# Patient Record
Sex: Female | Born: 1985
Health system: Southern US, Community
[De-identification: ages and names within clinical notes are randomized; demographics above are authoritative.]

## PROBLEM LIST (undated history)

## (undated) DIAGNOSIS — M069 Rheumatoid arthritis, unspecified: Secondary | ICD-10-CM

## (undated) DIAGNOSIS — Z720 Tobacco use: Secondary | ICD-10-CM

## (undated) DIAGNOSIS — R002 Palpitations: Secondary | ICD-10-CM

## (undated) DIAGNOSIS — M1990 Unspecified osteoarthritis, unspecified site: Secondary | ICD-10-CM

## (undated) DIAGNOSIS — R0602 Shortness of breath: Secondary | ICD-10-CM

## (undated) DIAGNOSIS — O3680X0 Pregnancy with inconclusive fetal viability, not applicable or unspecified: Secondary | ICD-10-CM

## (undated) DIAGNOSIS — R0789 Other chest pain: Secondary | ICD-10-CM

## (undated) HISTORY — DX: Unspecified osteoarthritis, unspecified site: M19.90

## (undated) HISTORY — DX: Shortness of breath: R06.02

## (undated) HISTORY — DX: Tobacco use: Z72.0

## (undated) HISTORY — DX: Rheumatoid arthritis, unspecified: M06.9

## (undated) HISTORY — DX: Other chest pain: R07.89

## (undated) HISTORY — DX: Palpitations: R00.2

---

## 2015-09-19 ENCOUNTER — Other Ambulatory Visit: Payer: Self-pay | Admitting: Family Medicine

## 2015-09-19 DIAGNOSIS — E049 Nontoxic goiter, unspecified: Secondary | ICD-10-CM

## 2015-09-22 ENCOUNTER — Other Ambulatory Visit: Payer: 59

## 2015-09-22 ENCOUNTER — Other Ambulatory Visit: Payer: Self-pay

## 2015-11-07 ENCOUNTER — Telehealth: Payer: Self-pay | Admitting: Cardiovascular Disease

## 2015-11-07 NOTE — Telephone Encounter (Signed)
Received records from [facility] for appointment on 12/12/15 with Dr Duke Salvia.  Records given to [facility] (medical records) for Dr Leonides Sake schedule on 12/12/15. lp

## 2015-11-25 ENCOUNTER — Ambulatory Visit (INDEPENDENT_AMBULATORY_CARE_PROVIDER_SITE_OTHER): Payer: 59 | Admitting: Cardiovascular Disease

## 2015-11-25 ENCOUNTER — Encounter: Payer: Self-pay | Admitting: Cardiovascular Disease

## 2015-11-25 VITALS — BP 112/73 | HR 89 | Ht 62.0 in | Wt 179.6 lb

## 2015-11-25 DIAGNOSIS — R0602 Shortness of breath: Secondary | ICD-10-CM | POA: Diagnosis not present

## 2015-11-25 DIAGNOSIS — Z720 Tobacco use: Secondary | ICD-10-CM

## 2015-11-25 DIAGNOSIS — R000 Tachycardia, unspecified: Secondary | ICD-10-CM

## 2015-11-25 DIAGNOSIS — I011 Acute rheumatic endocarditis: Secondary | ICD-10-CM | POA: Diagnosis not present

## 2015-11-25 DIAGNOSIS — M05711 Rheumatoid arthritis with rheumatoid factor of right shoulder without organ or systems involvement: Secondary | ICD-10-CM

## 2015-11-25 DIAGNOSIS — R002 Palpitations: Secondary | ICD-10-CM

## 2015-11-25 NOTE — Progress Notes (Signed)
Cardiology Office Note   Date:  11/27/2015   ID:  Asyria Kolander, DOB 27-Mar-1985, MRN [id number]  PCP:  Redmond Baseman, MD  Cardiologist:   Chilton Si, MD   Chief Complaint  Patient presents with  . New Patient (Initial Visit)    sob; when exerting self. rapid heartbeat lightheaded; very frequently. cramping in legs occasionally.      History of Present Illness: Nena Hampe is a 30 y.o. female with Rheumatoid Arthritis. who presents for an evaluation of chest pain and palpitations.  She reports frequent episodes of palpitations that have been ongoing for several months. Her heart beats fast and loud. She can hear beating in her ear and can see her chest moving. She also notes associated dizziness and weakness. The palpitations make her feel anxious, and anxiety also causes palpitations.  The symptoms occur both at rest and with exertion, though exertion does not seem to make it worse. She notes that if she stops thinking about it and does other activities that sometimes gets better. She also notes a sensation of chest tightness that radiates across the left side of her chest. This also occurs mostly at rest and happens at least once per week. The episode lasts for approximately 5 minutes and is associated with anxiety and shortness of breath. . However, she does not get much physical exercise. The most strenuous thing she does as cleaning her house, which does not cause worsening symptoms. She has joint swelling but denies edema. She also has not noted any orthopnea or PND.   Ms. Dicke also reports shortness of breath with minimal exertion.  She sometimes feels lightheaded and dizzy.  She did have one episode of syncope several months ago. However, this occurred in the setting of emesis and coughing.     Past Medical History:  Diagnosis Date  . Arthritis   . Palpitations 11/27/2015  . Rheumatoid arthritis (HCC) 11/27/2015  . Shortness of breath 11/27/2015  . Tobacco abuse  11/27/2015    No past surgical history on file.   Current Outpatient Prescriptions  Medication Sig Dispense Refill  . predniSONE (STERAPRED UNI-PAK 48 TAB) 5 MG (48) TBPK tablet USE AS DIRECTED FOR 12 DAYS  0   No current facility-administered medications for this visit.     Allergies:   Review of patient's allergies indicates not on file.    Social History:  The patient  reports that she has been smoking.  She does not have any smokeless tobacco history on file.   Family History:  The patient's family history includes Hypertension in her mother; Migraines in her mother.    ROS:  Please see the history of present illness.   Otherwise, review of systems are positive for allodynia.   All other systems are reviewed and negative.    PHYSICAL EXAM: VS:  BP 112/73   Pulse 89   Ht 5\' 2"  (1.575 m)   Wt 179 lb 9.6 oz (81.5 kg)   BMI 32.85 kg/m  , BMI Body mass index is 32.85 kg/m. GENERAL:  Well appearing HEENT:  Pupils equal round and reactive, fundi not visualized, oral mucosa unremarkable NECK:  No jugular venous distention, waveform within normal limits, carotid upstroke brisk and symmetric, no bruits, no thyromegaly LYMPHATICS:  No cervical adenopathy LUNGS:  Clear to auscultation bilaterally HEART:  RRR.  PMI not displaced or sustained,S1 and S2 within normal limits, no S3, no S4, no clicks, no rubs, no murmurs ABD:  Flat, positive bowel sounds  normal in frequency in pitch, no bruits, no rebound, no guarding, no midline pulsatile mass, no hepatomegaly, no splenomegaly EXT:  2 plus pulses throughout, no edema, no cyanosis no clubbing SKIN:  No rashes no nodules NEURO:  Cranial nerves II through XII grossly intact, motor grossly intact throughout PSYCH:  Cognitively intact, oriented to person place and time    EKG:  EKG is ordered today. The ekg ordered 11/25/15 demonstrates sinus rhythm rate 89 bpm.     Recent Labs: No results found for requested labs within last 8760  hours.  09/16/15: WBC 12.9, hemoglobin 9.7, hematocrit 32.5, platelets 431 Sodium 137, potassium 4.8, BUN 9, creatinine 0.56 AST 31, ALT 40 TSH within normal limits   Lipid Panel No results found for: CHOL, TRIG, HDL, CHOLHDL, VLDL, LDLCALC, LDLDIRECT    Wt Readings from Last 3 Encounters:  11/25/15 179 lb 9.6 oz (81.5 kg)      ASSESSMENT AND PLAN:  # Palpitations:  # Shortness of breath:  Ms. Pirro reports daily palpitations and was noted to have tachycardia and appointment with her PCP and rheumatologist. Today she is in sinus rhythm. She is having appropriate laboratory testing which has been notable for mild anemia but otherwise unremarkable. Her to be PVC is elevated, though she is on prednisone. We will obtain a 48 hour event monitor. Suspect that her symptoms may be related to anxiety. However,  we will also check a VQ scan to rule out chronic PE. Given her shortness of breath we will also obtain an echocardiogram to ensure that she does not have a pericardial effusion or any evidence of heart failure.   # Tobacco abuse: We discussed the importance of smoking cessation and she expressed understanding.   Current medicines are reviewed at length with the patient today.  The patient does not have concerns regarding medicines.  The following changes have been made:  no change  Labs/ tests ordered today include:   Orders Placed This Encounter  Procedures  . NM Pulmonary Perf and Vent  . DG Chest 2 View  . Holter monitor - 48 hour  . EKG 12-Lead  . ECHOCARDIOGRAM COMPLETE     Disposition:   FU with Sophia Cubero C. Duke Salvia, MD, [facility] in 1 month.     This note was written with the assistance of speech recognition software.  Please excuse any transcriptional errors.  Signed, Devere Brem C. Duke Salvia, MD, [facility]  11/27/2015 5:20 PM    [facility]

## 2015-11-25 NOTE — Patient Instructions (Addendum)
Medication Instructions:  Your physician recommends that you continue on your current medications as directed. Please refer to the Current Medication list given to you today.  Labwork: none  Testing/Procedures: A chest x-ray takes a picture of the organs and structures inside the chest, including the heart, lungs, and blood vessels. This test can show several things, including, whether the heart is enlarges; whether fluid is building up in the lungs; and whether pacemaker / defibrillator leads are still in place.  VQ SCAN  Your physician has recommended that you wear a holter monitor. Holter monitors are medical devices that record the heart's electrical activity. Doctors most often use these monitors to diagnose arrhythmias. Arrhythmias are problems with the speed or rhythm of the heartbeat. The monitor is a small, portable device. You can wear one while you do your normal daily activities. This is usually used to diagnose what is causing palpitations/syncope (passing out). 63 HOUR  Your physician has requested that you have an echocardiogram. Echocardiography is a painless test that uses sound waves to create images of your heart. It provides your doctor with information about the size and shape of your heart and how well your heart's chambers and valves are working. This procedure takes approximately one hour. There are no restrictions for this procedure. [facility] AT [address]  Follow-Up: Your physician recommends that you schedule a follow-up appointment in: 1 MONTH OV  If you need a refill on your cardiac medications before your next appointment, please call your pharmacy.  Ventilation-Perfusion Scan A ventilation-perfusion scan is a scan to look at the airflow (ventilation) and blood flow (perfusion) in your lungs. It is most often used to look for blood clots that may have traveled to your lungs. During this scan, radioactive compounds are injected into your body or  are breathed in (inhale). These radioactive compounds are detected by a special camera during the scan, are given at very low doses, are not harmful to you, and last in your body for a very short time.  LET [facility] CARE PROVIDER KNOW ABOUT:  Any allergies you have.  All medicines you are taking, including vitamins, herbs, eye drops, creams, and over-the-counter medicines.  Any blood disorders you have.  Previous surgeries you have had.  Medical conditions you have.  Possibility of pregnancy, if this applies.  Breastfeeding, if this applies. RISKS AND COMPLICATIONS Generally, this is a safe procedure. However, as with any procedure, complications can occur. A possible complication includes having an allergic reaction to the radioactive compounds.  BEFORE THE PROCEDURE  Do not smoke before your test.  Take medicine as directed by your health care provider. PROCEDURE  A small needle will be placed in a vein in your arm or hand. This needle will stay in place for the entire exam.  A small amount of very short-acting radioactive material will be injected.  Your lungs will then be scanned using a special camera. This camera will record the images.  You will be asked to inhale a second radioactive compound. After this, the lungs are scanned again. AFTER THE PROCEDURE  You may go home unless your health care provider instructs you differently.  You may continue with normal activities and diet as instructed by your health care provider.   This information is not intended to replace advice given to you by your health care provider. Make sure you discuss any questions you have with your health care provider.   Document Released: 03/02/2000 Document  Revised: 03/26/2014 Document Reviewed: 09/18/2012 Elsevier Interactive Patient Education [employer]  Echocardiogram An echocardiogram, or echocardiography, uses sound waves (ultrasound) to produce an image of your heart. The  echocardiogram is simple, painless, obtained within a short period of time, and offers valuable information to your health care provider. The images from an echocardiogram can provide information such as:  Evidence of coronary artery disease (CAD).  Heart size.  Heart muscle function.  Heart valve function.  Aneurysm detection.  Evidence of a past heart attack.  Fluid buildup around the heart.  Heart muscle thickening.  Assess heart valve function. LET [facility] CARE PROVIDER KNOW ABOUT:  Any allergies you have.  All medicines you are taking, including vitamins, herbs, eye drops, creams, and over-the-counter medicines.  Previous problems you or members of your family have had with the use of anesthetics.  Any blood disorders you have.  Previous surgeries you have had.  Medical conditions you have.  Possibility of pregnancy, if this applies. BEFORE THE PROCEDURE  No special preparation is needed. Eat and drink normally.  PROCEDURE   In order to produce an image of your heart, gel will be applied to your chest and a wand-like tool (transducer) will be moved over your chest. The gel will help transmit the sound waves from the transducer. The sound waves will harmlessly bounce off your heart to allow the heart images to be captured in real-time motion. These images will then be recorded.  You may need an IV to receive a medicine that improves the quality of the pictures. AFTER THE PROCEDURE You may return to your normal schedule including diet, activities, and medicines, unless your health care provider tells you otherwise.   This information is not intended to replace advice given to you by your health care provider. Make sure you discuss any questions you have with your health care provider.   Document Released: 03/02/2000 Document Revised: 03/26/2014 Document Reviewed: 11/10/2012 Elsevier Interactive Patient Education [employer]

## 2015-11-27 ENCOUNTER — Encounter: Payer: Self-pay | Admitting: Cardiovascular Disease

## 2015-11-27 DIAGNOSIS — R0602 Shortness of breath: Secondary | ICD-10-CM

## 2015-11-27 DIAGNOSIS — R002 Palpitations: Secondary | ICD-10-CM

## 2015-11-27 DIAGNOSIS — M069 Rheumatoid arthritis, unspecified: Secondary | ICD-10-CM

## 2015-11-27 DIAGNOSIS — Z720 Tobacco use: Secondary | ICD-10-CM

## 2015-11-27 HISTORY — DX: Tobacco use: Z72.0

## 2015-11-27 HISTORY — DX: Rheumatoid arthritis, unspecified: M06.9

## 2015-11-27 HISTORY — DX: Shortness of breath: R06.02

## 2015-11-27 HISTORY — DX: Palpitations: R00.2

## 2015-12-02 ENCOUNTER — Telehealth: Payer: Self-pay | Admitting: Cardiovascular Disease

## 2015-12-02 NOTE — Telephone Encounter (Signed)
Patient and I have played phone tag regarding times for appts.  I left times and instructions on patient's voicemail on 11-29-15.

## 2015-12-05 ENCOUNTER — Encounter (HOSPITAL_COMMUNITY): Payer: 59

## 2015-12-05 ENCOUNTER — Ambulatory Visit (HOSPITAL_COMMUNITY): Payer: 59

## 2015-12-12 ENCOUNTER — Ambulatory Visit (INDEPENDENT_AMBULATORY_CARE_PROVIDER_SITE_OTHER): Payer: 59

## 2015-12-12 ENCOUNTER — Other Ambulatory Visit: Payer: Self-pay

## 2015-12-12 ENCOUNTER — Ambulatory Visit (HOSPITAL_COMMUNITY): Payer: 59 | Attending: Cardiovascular Disease

## 2015-12-12 ENCOUNTER — Ambulatory Visit: Payer: 59 | Admitting: Cardiovascular Disease

## 2015-12-12 DIAGNOSIS — R000 Tachycardia, unspecified: Secondary | ICD-10-CM | POA: Diagnosis not present

## 2015-12-12 DIAGNOSIS — I011 Acute rheumatic endocarditis: Secondary | ICD-10-CM

## 2015-12-12 DIAGNOSIS — R0602 Shortness of breath: Secondary | ICD-10-CM | POA: Diagnosis not present

## 2015-12-15 ENCOUNTER — Telehealth: Payer: Self-pay | Admitting: *Deleted

## 2015-12-15 NOTE — Telephone Encounter (Signed)
Advised patient of echo results  Did ask patient about her VQ scan that she cancelled  Patient stated she cancelled secondary to cost and will discuss further with Dr Duke Salvia at follow up next month

## 2015-12-30 ENCOUNTER — Ambulatory Visit: Payer: 59 | Admitting: Cardiovascular Disease

## 2016-01-22 NOTE — Progress Notes (Deleted)
Cardiology Office Note   Date:  01/22/2016   ID:  Sandra Sosa, DOB 02-Apr-1985, MRN [id number]  PCP:  Redmond Baseman, MD  Cardiologist:   Chilton Si, MD   No chief complaint on file.     History of Present Illness: Sandra Sosa is a 30 y.o. female with Rheumatoid Arthritis. who presents for an evaluation of chest pain and palpitations.  She reports frequent episodes of palpitations that have been ongoing for several months. Her heart beats fast and loud. She can hear beating in her ear and can see her chest moving. She also notes associated dizziness and weakness. The palpitations make her feel anxious, and anxiety also causes palpitations.  The symptoms occur both at rest and with exertion, though exertion does not seem to make it worse. She notes that if she stops thinking about it and does other activities that sometimes gets better. She also notes a sensation of chest tightness that radiates across the left side of her chest. This also occurs mostly at rest and happens at least once per week. The episode lasts for approximately 5 minutes and is associated with anxiety and shortness of breath. . However, she does not get much physical exercise. The most strenuous thing she does as cleaning her house, which does not cause worsening symptoms. She has joint swelling but denies edema. She also has not noted any orthopnea or PND.   [name] also reports shortness of breath with minimal exertion.  She sometimes feels lightheaded and dizzy.  She did have one episode of syncope several months ago. However, this occurred in the setting of emesis and coughing.     Past Medical History:  Diagnosis Date  . Arthritis   . Palpitations 11/27/2015  . Rheumatoid arthritis (HCC) 11/27/2015  . Shortness of breath 11/27/2015  . Tobacco abuse 11/27/2015    No past surgical history on file.   Current Outpatient Prescriptions  Medication Sig Dispense Refill  . predniSONE (STERAPRED  UNI-PAK 48 TAB) 5 MG (48) TBPK tablet USE AS DIRECTED FOR 12 DAYS  0   No current facility-administered medications for this visit.     Allergies:   Patient has no allergy information on record.    Social History:  The patient  reports that she has been smoking.  She does not have any smokeless tobacco history on file.   Family History:  The patient's family history includes Hypertension in her mother; Migraines in her mother.    ROS:  Please see the history of present illness.   Otherwise, review of systems are positive for allodynia.   All other systems are reviewed and negative.    PHYSICAL EXAM: VS:  There were no vitals taken for this visit. , BMI There is no height or weight on file to calculate BMI. GENERAL:  Well appearing HEENT:  Pupils equal round and reactive, fundi not visualized, oral mucosa unremarkable NECK:  No jugular venous distention, waveform within normal limits, carotid upstroke brisk and symmetric, no bruits, no thyromegaly LYMPHATICS:  No cervical adenopathy LUNGS:  Clear to auscultation bilaterally HEART:  RRR.  PMI not displaced or sustained,S1 and S2 within normal limits, no S3, no S4, no clicks, no rubs, no murmurs ABD:  Flat, positive bowel sounds normal in frequency in pitch, no bruits, no rebound, no guarding, no midline pulsatile mass, no hepatomegaly, no splenomegaly EXT:  2 plus pulses throughout, no edema, no cyanosis no clubbing SKIN:  No rashes no nodules NEURO:  Cranial  nerves II through XII grossly intact, motor grossly intact throughout [facility]:  Cognitively intact, oriented to person place and time    EKG:  EKG is ordered today. The ekg ordered 11/25/15 demonstrates sinus rhythm rate 89 bpm.     Recent Labs: No results found for requested labs within last 8760 hours.  09/16/15: WBC 12.9, hemoglobin 9.7, hematocrit 32.5, platelets 431 Sodium 137, potassium 4.8, BUN 9, creatinine 0.56 AST 31, ALT 40 TSH within normal limits   Lipid  Panel No results found for: CHOL, TRIG, HDL, CHOLHDL, VLDL, LDLCALC, LDLDIRECT    Wt Readings from Last 3 Encounters:  11/25/15 81.5 kg (179 lb 9.6 oz)      ASSESSMENT AND PLAN:  # Palpitations:  # Shortness of breath:  [name] reports daily palpitations and was noted to have tachycardia and appointment with her PCP and rheumatologist. Today she is in sinus rhythm. She is having appropriate laboratory testing which has been notable for mild anemia but otherwise unremarkable. Her to be PVC is elevated, though she is on prednisone. We will obtain a 48 hour event monitor. Suspect that her symptoms may be related to anxiety. However,  we will also check a VQ scan to rule out chronic PE. Given her shortness of breath we will also obtain an echocardiogram to ensure that she does not have a pericardial effusion or any evidence of heart failure.   # Tobacco abuse: We discussed the importance of smoking cessation and she expressed understanding.   Current medicines are reviewed at length with the patient today.  The patient does not have concerns regarding medicines.  The following changes have been made:  no change  Labs/ tests ordered today include:   No orders of the defined types were placed in this encounter.    Disposition:   FU with Dae Highley C. Duke Salvia, MD, [facility] in 1 month.     This note was written with the assistance of speech recognition software.  Please excuse any transcriptional errors.  Signed, Dael Howland C. Duke Salvia, MD, [facility]  01/22/2016 3:46 PM    [facility]

## 2016-01-23 ENCOUNTER — Ambulatory Visit: Payer: 59 | Admitting: Cardiovascular Disease

## 2016-02-03 ENCOUNTER — Ambulatory Visit (INDEPENDENT_AMBULATORY_CARE_PROVIDER_SITE_OTHER): Payer: 59 | Admitting: Cardiovascular Disease

## 2016-02-03 ENCOUNTER — Encounter: Payer: Self-pay | Admitting: Cardiovascular Disease

## 2016-02-03 VITALS — BP 111/78 | HR 103 | Ht 62.0 in | Wt 180.2 lb

## 2016-02-03 DIAGNOSIS — R0602 Shortness of breath: Secondary | ICD-10-CM

## 2016-02-03 DIAGNOSIS — Z720 Tobacco use: Secondary | ICD-10-CM | POA: Diagnosis not present

## 2016-02-03 DIAGNOSIS — R002 Palpitations: Secondary | ICD-10-CM

## 2016-02-03 NOTE — Progress Notes (Signed)
Cardiology Office Note   Date:  02/03/2016   ID:  Sandra Sosa, DOB Nov 06, 1985, MRN [id number]  PCP:  Redmond Baseman, MD  Cardiologist:   Chilton Si, MD   Chief Complaint  Patient presents with  . Follow-up    monitor results      History of Present Illness: Sandra Sosa is a 30 y.o. female with Rheumatoid Arthritis and palpitations who presents for follow up.  She was seen in clinic 11/25/15 due to palpitations.  She was referred for a 48 hour Holter That showed an average heart rate of 95 bpm and very rare PACs and PVCs. These were not associated with any symptoms at the time of the study. She also reported shortness of breath so she had an echo 12/12/15 that revealed LVEF 55-60% and no abnormalities. Since that time she continues to have palpitations frequently. He typically occurs in the setting of anxiety. She denies chest pain, lower extremity edema, orthopnea, or PND. She does have joint swelling but that is attributable to her rheumatoid arthritis. She has been holding her arthritis medication because she had her husband are trying to get pregnant.   Past Medical History:  Diagnosis Date  . Arthritis   . Palpitations 11/27/2015  . Rheumatoid arthritis (HCC) 11/27/2015  . Shortness of breath 11/27/2015  . Tobacco abuse 11/27/2015    No past surgical history on file.   Current Outpatient Prescriptions  Medication Sig Dispense Refill  . acetaminophen (TYLENOL) 325 MG tablet Take 650 mg by mouth every 6 (six) hours as needed.     No current facility-administered medications for this visit.     Allergies:   Patient has no allergy information on record.    Social History:  The patient  reports that she has been smoking.  She does not have any smokeless tobacco history on file.   Family History:  The patient's family history includes Hypertension in her mother; Migraines in her mother.    ROS:  Please see the history of present illness.   Otherwise, review  of systems are positive for allodynia.   All other systems are reviewed and negative.    PHYSICAL EXAM: VS:  BP 111/78   Pulse (!) 103   Ht 5\' 2"  (1.575 m)   Wt 81.7 kg (180 lb 3.2 oz)   BMI 32.96 kg/m  , BMI Body mass index is 32.96 kg/m. GENERAL:  Well appearing HEENT:  Pupils equal round and reactive, fundi not visualized, oral mucosa unremarkable NECK:  No jugular venous distention, waveform within normal limits, carotid upstroke brisk and symmetric, no bruits, no thyromegaly LYMPHATICS:  No cervical adenopathy LUNGS:  Clear to auscultation bilaterally HEART:  RRR.  PMI not displaced or sustained,S1 and S2 within normal limits, no S3, no S4, no clicks, no rubs, no murmurs ABD:  Flat, positive bowel sounds normal in frequency in pitch, no bruits, no rebound, no guarding, no midline pulsatile mass, no hepatomegaly, no splenomegaly EXT:  2 plus pulses throughout, no edema, no cyanosis no clubbing SKIN:  No rashes no nodules NEURO:  Cranial nerves II through XII grossly intact, motor grossly intact throughout PSYCH:  Cognitively intact, oriented to person place and time    EKG:  EKG is ordered today. The ekg ordered 11/25/15 demonstrates sinus rhythm rate 89 bpm.     Recent Labs: No results found for requested labs within last 8760 hours.  09/16/15: WBC 12.9, hemoglobin 9.7, hematocrit 32.5, platelets 431 Sodium 137, potassium 4.8, BUN  9, creatinine 0.56 AST 31, ALT 40 TSH within normal limits  48 Hour Holter Monitor 12/12/15:  Quality: Fair.  Baseline artifact. Predominant rhythm: Sinus rhythm Average heart rate: 95 bpm Min heart rate: 50 bpm Max heart rate: 170 bpm  Rare PACs and PVCs  Short runs of atrial tachycardia   Echo 12/12/15: Study Conclusions  - Left ventricle: The cavity size was normal. Systolic function was   normal. The estimated ejection fraction was in the range of 55%   to 60%. Wall motion was normal; there were no regional wall   motion  abnormalities. Left ventricular diastolic function   parameters were normal.  Lipid Panel No results found for: CHOL, TRIG, HDL, CHOLHDL, VLDL, LDLCALC, LDLDIRECT    Wt Readings from Last 3 Encounters:  02/03/16 81.7 kg (180 lb 3.2 oz)  11/25/15 81.5 kg (179 lb 9.6 oz)      ASSESSMENT AND PLAN:  # Palpitations:  # Shortness of breath:  48 hour Holter showed occasional PACs and PVCs.  I suspect that many of her symptoms are related to anxiety. Laboratory testing his all been within normal limits. There is no evidence of heart failure on her echo and she is euvolemic on exam. I did inform her that it is possible her symptoms will worsen in the setting of pregnancy. I reiterated the fact that this is not dangerous to either her or the baby. We discussed the option of trying a beta blocker. However, this is likely to make her more symptomatic as her blood pressure is low. She agrees that we will avoid pharmacologic interventions at this time.   # Tobacco abuse: We discussed the importance of smoking cessation and she expressed understanding.   Current medicines are reviewed at length with the patient today.  The patient does not have concerns regarding medicines.  The following changes have been made:  no change  Labs/ tests ordered today include:   No orders of the defined types were placed in this encounter.    Disposition:   FU with Marchello Rothgeb C. Duke Salvia, MD, [facility] in 3 months.     This note was written with the assistance of speech recognition software.  Please excuse any transcriptional errors.  Signed, Brita Jurgensen C. Duke Salvia, MD, [facility]  02/03/2016 3:39 PM    [facility]

## 2016-02-03 NOTE — Patient Instructions (Signed)
Medication Instructions:  Your physician recommends that you continue on your current medications as directed. Please refer to the Current Medication list given to you today.  Labwork: none  Testing/Procedures: none  Follow-Up: Your physician recommends that you schedule a follow-up appointment in: 3 month ov  If you need a refill on your cardiac medications before your next appointment, please call your pharmacy.  

## 2016-03-07 ENCOUNTER — Telehealth: Payer: Self-pay | Admitting: Cardiovascular Disease

## 2016-03-07 NOTE — Telephone Encounter (Signed)
Spoke with pt husband, his wife is having palpitations that are related to anxiety. He is researching getting a dog for the patient to help with her anxiety. He is asking for a note from [place] that would state a dog maybe helpful to the patient and her symptoms. Will forward to dr Duke Salvia for okay to generate letter.

## 2016-03-07 NOTE — Telephone Encounter (Signed)
Please call,husband need to discuss getting some help for his wife emotional condition.

## 2016-03-07 NOTE — Telephone Encounter (Signed)
I think that this might be helpful.  However I don't specialize in treating anxiety.  It might be helpful for her to talk with a psychiatrist or psychologist.  I'm happy to give her a letter stating that she doesn't have any cardiac issues that would prohibit her from having a dog, but I'm not sure how that would be helpful to her.

## 2016-03-08 NOTE — Telephone Encounter (Signed)
Left message to call back  

## 2016-03-08 NOTE — Telephone Encounter (Signed)
Advised husband  

## 2016-05-09 ENCOUNTER — Ambulatory Visit: Payer: 59 | Admitting: Cardiovascular Disease

## 2016-05-09 NOTE — Progress Notes (Deleted)
Cardiology Office Note   Date:  05/09/2016   ID:  Sandra Sosa, DOB 1985/07/29, MRN [id number]  PCP:  Redmond Baseman, MD  Cardiologist:   Chilton Si, MD   No chief complaint on file.     History of Present Illness: Sandra Sosa is a 31 y.o. female with Rheumatoid Arthritis, PACs, PVCs and atrial tachycardia who presents for follow up.  She was seen in clinic 11/25/15 due to palpitations.  She was referred for a 48 hour Holter that showed an average heart rate of 95 bpm and very rare PACs and PVCs.  It also showed a short run of atrial tachycardia. These were not associated with any symptoms at the time of the study. She also reported shortness of breath so she had an echo 12/12/15 that revealed LVEF 55-60% and no abnormalities. Since that time she continues to have palpitations frequently. He typically occurs in the setting of anxiety. She denies chest pain, lower extremity edema, orthopnea, or PND. She does have joint swelling but that is attributable to her rheumatoid arthritis. She has been holding her arthritis medication because she had her husband are trying to get pregnant.    F/u tobacco    Past Medical History:  Diagnosis Date  . Arthritis   . Palpitations 11/27/2015  . Rheumatoid arthritis (HCC) 11/27/2015  . Shortness of breath 11/27/2015  . Tobacco abuse 11/27/2015    No past surgical history on file.   Current Outpatient Prescriptions  Medication Sig Dispense Refill  . acetaminophen (TYLENOL) 325 MG tablet Take 650 mg by mouth every 6 (six) hours as needed.     No current facility-administered medications for this visit.     Allergies:   Patient has no allergy information on record.    Social History:  The patient  reports that she has been smoking.  She does not have any smokeless tobacco history on file.   Family History:  The patient's family history includes Hypertension in her mother; Migraines in her mother.    ROS:  Please see the history  of present illness.   Otherwise, review of systems are positive for allodynia.   All other systems are reviewed and negative.    PHYSICAL EXAM: VS:  There were no vitals taken for this visit. , BMI There is no height or weight on file to calculate BMI. GENERAL:  Well appearing HEENT:  Pupils equal round and reactive, fundi not visualized, oral mucosa unremarkable NECK:  No jugular venous distention, waveform within normal limits, carotid upstroke brisk and symmetric, no bruits, no thyromegaly LYMPHATICS:  No cervical adenopathy LUNGS:  Clear to auscultation bilaterally HEART:  RRR.  PMI not displaced or sustained,S1 and S2 within normal limits, no S3, no S4, no clicks, no rubs, no murmurs ABD:  Flat, positive bowel sounds normal in frequency in pitch, no bruits, no rebound, no guarding, no midline pulsatile mass, no hepatomegaly, no splenomegaly EXT:  2 plus pulses throughout, no edema, no cyanosis no clubbing SKIN:  No rashes no nodules NEURO:  Cranial nerves II through XII grossly intact, motor grossly intact throughout PSYCH:  Cognitively intact, oriented to person place and time    EKG:  EKG is ordered today. The ekg ordered 11/25/15 demonstrates sinus rhythm rate 89 bpm.     Recent Labs: No results found for requested labs within last 8760 hours.  09/16/15: WBC 12.9, hemoglobin 9.7, hematocrit 32.5, platelets 431 Sodium 137, potassium 4.8, BUN 9, creatinine 0.56 AST 31, ALT 40  TSH within normal limits  48 Hour Holter Monitor 12/12/15:  Quality: Fair.  Baseline artifact. Predominant rhythm: Sinus rhythm Average heart rate: 95 bpm Min heart rate: 50 bpm Max heart rate: 170 bpm  Rare PACs and PVCs  Short runs of atrial tachycardia   Echo 12/12/15: Study Conclusions  - Left ventricle: The cavity size was normal. Systolic function was   normal. The estimated ejection fraction was in the range of 55%   to 60%. Wall motion was normal; there were no regional wall   motion  abnormalities. Left ventricular diastolic function   parameters were normal.  Lipid Panel No results found for: CHOL, TRIG, HDL, CHOLHDL, VLDL, LDLCALC, LDLDIRECT    Wt Readings from Last 3 Encounters:  02/03/16 81.7 kg (180 lb 3.2 oz)  11/25/15 81.5 kg (179 lb 9.6 oz)      ASSESSMENT AND PLAN:  # Palpitations:  # Shortness of breath:  48 hour Holter showed occasional PACs and PVCs.  I suspect that many of her symptoms are related to anxiety. Laboratory testing his all been within normal limits. There is no evidence of heart failure on her echo and she is euvolemic on exam. I did inform her that it is possible her symptoms will worsen in the setting of pregnancy. I reiterated the fact that this is not dangerous to either her or the baby. We discussed the option of trying a beta blocker. However, this is likely to make her more symptomatic as her blood pressure is low. She agrees that we will avoid pharmacologic interventions at this time.   # Tobacco abuse: We discussed the importance of smoking cessation and she expressed understanding.   Current medicines are reviewed at length with the patient today.  The patient does not have concerns regarding medicines.  The following changes have been made:  no change  Labs/ tests ordered today include:   No orders of the defined types were placed in this encounter.    Disposition:   FU with Dodd Schmid C. Duke Salvia, MD, [facility] in 3 months.     This note was written with the assistance of speech recognition software.  Please excuse any transcriptional errors.  Signed, Aydan Phoenix C. Duke Salvia, MD, [facility]  05/09/2016 10:16 AM    [facility]

## 2016-05-14 ENCOUNTER — Ambulatory Visit (INDEPENDENT_AMBULATORY_CARE_PROVIDER_SITE_OTHER): Payer: BLUE CROSS/BLUE SHIELD | Admitting: Cardiovascular Disease

## 2016-05-14 ENCOUNTER — Encounter: Payer: Self-pay | Admitting: Cardiovascular Disease

## 2016-05-14 VITALS — BP 121/82 | HR 110 | Ht 62.0 in | Wt 182.6 lb

## 2016-05-14 DIAGNOSIS — R000 Tachycardia, unspecified: Secondary | ICD-10-CM

## 2016-05-14 DIAGNOSIS — Z720 Tobacco use: Secondary | ICD-10-CM

## 2016-05-14 DIAGNOSIS — Z13220 Encounter for screening for lipoid disorders: Secondary | ICD-10-CM | POA: Diagnosis not present

## 2016-05-14 DIAGNOSIS — R0789 Other chest pain: Secondary | ICD-10-CM | POA: Diagnosis not present

## 2016-05-14 HISTORY — DX: Other chest pain: R07.89

## 2016-05-14 NOTE — Progress Notes (Signed)
Cardiology Office Note   Date:  05/14/2016   ID:  Sandra Sosa, DOB 06/16/1985, MRN [id number]  PCP:  Redmond Baseman, MD  Cardiologist:   Chilton Si, MD   Chief Complaint  Patient presents with  . Follow-up  . Shortness of Breath    occasionally.  . Chest Pain    pressure.      History of Present Illness: Sandra Sosa is a 31 y.o. female with Rheumatoid Arthritis, PACs, PVCs and atrial tachycardia who presents for follow up.  She was seen in clinic 11/25/15 due to palpitations.  She was referred for a 48 hour Holter that showed an average heart rate of 95 bpm and very rare PACs and PVCs.  It also showed a short run of atrial tachycardia. These were not associated with any symptoms at the time of the study. She also reported shortness of breath so she had an echo 12/12/15 that revealed LVEF 55-60% and no abnormalities. Since that time she continues to have palpitations frequently. He typically occurs in the setting of anxiety. She denies chest pain, lower extremity edema, orthopnea, or PND. She does have joint swelling but that is attributable to her rheumatoid arthritis. She has been holding her arthritis medication because she had her husband are trying to get pregnant.  Since her last appointment Sandra Sosa continues to have palpitations and feels pressure in her chest that lasts for days at a time.  She reports dizziness that is worse when she turns over in bed at night or when she is walking.  She denies syncope.  She is currently struggling with a URI.  She has episodes when she feels like she has to take a deep breath.  She denies lower extremity edema, orthopnea or PND.     Past Medical History:  Diagnosis Date  . Arthritis   . Atypical chest pain 05/14/2016  . Palpitations 11/27/2015  . Rheumatoid arthritis (HCC) 11/27/2015  . Shortness of breath 11/27/2015  . Tobacco abuse 11/27/2015    No past surgical history on file.   No current outpatient prescriptions on  file.   No current facility-administered medications for this visit.     Allergies:   Patient has no allergy information on record.    Social History:  The patient  reports that she has been smoking.  She has never used smokeless tobacco.   Family History:  The patient's family history includes Hypertension in her mother; Migraines in her mother.    ROS:  Please see the history of present illness.   Otherwise, review of systems are positive for allodynia.   All other systems are reviewed and negative.    PHYSICAL EXAM: VS:  BP 121/82   Pulse (!) 110   Ht 5\' 2"  (1.575 m)   Wt 82.8 kg (182 lb 9.6 oz)   BMI 33.40 kg/m  , BMI Body mass index is 33.4 kg/m. GENERAL:  Well appearing HEENT:  Pupils equal round and reactive, fundi not visualized, oral mucosa unremarkable NECK:  No jugular venous distention, waveform within normal limits, carotid upstroke brisk and symmetric, no bruits LYMPHATICS:  No cervical adenopathy LUNGS:  Clear to auscultation bilaterally HEART:  RRR.  PMI not displaced or sustained,S1 and S2 within normal limits, no S3, no S4, no clicks, no rubs, no murmurs ABD:  Flat, positive bowel sounds normal in frequency in pitch, no bruits, no rebound, no guarding, no midline pulsatile mass, no hepatomegaly, no splenomegaly EXT:  2 plus pulses throughout,  no edema, no cyanosis no clubbing SKIN:  No rashes no nodules NEURO:  Cranial nerves II through XII grossly intact, motor grossly intact throughout PSYCH:  Cognitively intact, oriented to person place and time   EKG:  EKG is ordered today. The ekg ordered 11/25/15 demonstrates sinus rhythm rate 89 bpm.     Recent Labs: No results found for requested labs within last 8760 hours.  09/16/15: WBC 12.9, hemoglobin 9.7, hematocrit 32.5, platelets 431 Sodium 137, potassium 4.8, BUN 9, creatinine 0.56 AST 31, ALT 40 TSH within normal limits  48 Hour Holter Monitor 12/12/15:  Quality: Fair.  Baseline  artifact. Predominant rhythm: Sinus rhythm Average heart rate: 95 bpm Min heart rate: 50 bpm Max heart rate: 170 bpm  Rare PACs and PVCs  Short runs of atrial tachycardia   Echo 12/12/15: Study Conclusions  - Left ventricle: The cavity size was normal. Systolic function was   normal. The estimated ejection fraction was in the range of 55%   to 60%. Wall motion was normal; there were no regional wall   motion abnormalities. Left ventricular diastolic function   parameters were normal.  Lipid Panel No results found for: CHOL, TRIG, HDL, CHOLHDL, VLDL, LDLCALC, LDLDIRECT    Wt Readings from Last 3 Encounters:  05/14/16 82.8 kg (182 lb 9.6 oz)  02/03/16 81.7 kg (180 lb 3.2 oz)  11/25/15 81.5 kg (179 lb 9.6 oz)      ASSESSMENT AND PLAN:  # Palpitations:  # Shortness of breath:  48 hour Holter showed occasional PACs and PVCs.  I suspect that many of her symptoms are related to anxiety. Laboratory testing his all been within normal limits. There is no evidence of heart failure on her echo and she is euvolemic on exam.  She is not interested in trying a beta blocker.  # Chest pain: Symptoms are atypical.  No ischemia evaluation at this time.  Check fasting lipids.  # Tobacco abuse: Continued to encourage smoking cessation.  Current medicines are reviewed at length with the patient today.  The patient does not have concerns regarding medicines.  The following changes have been made:  no change  Labs/ tests ordered today include:   Orders Placed This Encounter  Procedures  . Lipid panel  . Comprehensive metabolic panel     Disposition:   FU with Naren Benally C. Duke Salvia, MD, [facility] in 6 months.     This note was written with the assistance of speech recognition software.  Please excuse any transcriptional errors.  Signed, Cadie Sorci C. Duke Salvia, MD, [facility]  05/14/2016 12:28 PM    [facility]

## 2016-05-14 NOTE — Patient Instructions (Addendum)
Medication Instructions:  No changes  Labwork: Fasting lp/cmet at [facility] lab soon   Testing/Procedures: none  Follow-Up: Your physician wants you to follow-up in: 6 month ov You will receive a reminder letter in the mail two months in advance. If you don't receive a letter, please call our office to schedule the follow-up appointment.  If you need a refill on your cardiac medications before your next appointment, please call your pharmacy.

## 2017-04-10 DIAGNOSIS — Z79899 Other long term (current) drug therapy: Secondary | ICD-10-CM | POA: Diagnosis not present

## 2017-04-10 DIAGNOSIS — M059 Rheumatoid arthritis with rheumatoid factor, unspecified: Secondary | ICD-10-CM | POA: Diagnosis not present

## 2017-04-25 DIAGNOSIS — R3915 Urgency of urination: Secondary | ICD-10-CM | POA: Diagnosis not present

## 2017-04-25 DIAGNOSIS — R102 Pelvic and perineal pain: Secondary | ICD-10-CM | POA: Diagnosis not present

## 2017-04-25 DIAGNOSIS — R350 Frequency of micturition: Secondary | ICD-10-CM | POA: Diagnosis not present

## 2017-07-16 DIAGNOSIS — L680 Hirsutism: Secondary | ICD-10-CM | POA: Diagnosis not present

## 2017-07-16 DIAGNOSIS — L700 Acne vulgaris: Secondary | ICD-10-CM | POA: Diagnosis not present

## 2017-07-23 DIAGNOSIS — R5383 Other fatigue: Secondary | ICD-10-CM | POA: Diagnosis not present

## 2017-07-23 DIAGNOSIS — R05 Cough: Secondary | ICD-10-CM | POA: Diagnosis not present

## 2017-07-23 DIAGNOSIS — D508 Other iron deficiency anemias: Secondary | ICD-10-CM | POA: Diagnosis not present

## 2017-07-23 DIAGNOSIS — L0231 Cutaneous abscess of buttock: Secondary | ICD-10-CM | POA: Diagnosis not present

## 2017-07-26 DIAGNOSIS — M059 Rheumatoid arthritis with rheumatoid factor, unspecified: Secondary | ICD-10-CM | POA: Diagnosis not present

## 2017-07-26 DIAGNOSIS — Z79899 Other long term (current) drug therapy: Secondary | ICD-10-CM | POA: Diagnosis not present

## 2017-07-26 DIAGNOSIS — R790 Abnormal level of blood mineral: Secondary | ICD-10-CM | POA: Diagnosis not present

## 2017-07-31 DIAGNOSIS — R6884 Jaw pain: Secondary | ICD-10-CM | POA: Diagnosis not present

## 2017-07-31 DIAGNOSIS — R51 Headache: Secondary | ICD-10-CM | POA: Diagnosis not present

## 2017-08-26 DIAGNOSIS — D508 Other iron deficiency anemias: Secondary | ICD-10-CM | POA: Diagnosis not present

## 2017-08-26 DIAGNOSIS — R5383 Other fatigue: Secondary | ICD-10-CM | POA: Diagnosis not present

## 2017-08-26 DIAGNOSIS — M069 Rheumatoid arthritis, unspecified: Secondary | ICD-10-CM | POA: Diagnosis not present

## 2017-09-09 DIAGNOSIS — N970 Female infertility associated with anovulation: Secondary | ICD-10-CM | POA: Diagnosis not present

## 2017-09-11 ENCOUNTER — Encounter: Payer: Self-pay | Admitting: Cardiovascular Disease

## 2017-09-11 ENCOUNTER — Ambulatory Visit (INDEPENDENT_AMBULATORY_CARE_PROVIDER_SITE_OTHER): Payer: 59 | Admitting: Cardiovascular Disease

## 2017-09-11 VITALS — BP 92/70 | HR 73 | Ht 62.0 in | Wt 183.8 lb

## 2017-09-11 DIAGNOSIS — I011 Acute rheumatic endocarditis: Secondary | ICD-10-CM

## 2017-09-11 DIAGNOSIS — Z720 Tobacco use: Secondary | ICD-10-CM | POA: Diagnosis not present

## 2017-09-11 DIAGNOSIS — R079 Chest pain, unspecified: Secondary | ICD-10-CM

## 2017-09-11 DIAGNOSIS — R002 Palpitations: Secondary | ICD-10-CM | POA: Diagnosis not present

## 2017-09-11 NOTE — Progress Notes (Signed)
Cardiology Office Note   Date:  09/13/2017   ID:  Sandra Sosa, DOB [birth year], MRN [id number]  PCP:  Ileana Ladd, MD  Cardiologist:   Chilton Si, MD   Chief Complaint  Patient presents with  . Follow-up    1 year,       History of Present Illness: Sandra Sosa is a 32 y.o. female with Rheumatoid Arthritis, PACs, PVCs and atrial tachycardia who presents for follow up.  She was seen in clinic 11/25/15 due to palpitations.  She was referred for a 48 hour Holter that showed an average heart rate of 95 bpm and very rare PACs and PVCs.  It also showed a short run of atrial tachycardia. These were not associated with any symptoms at the time of the study. She also reported shortness of breath so she had an echo 12/12/15 that revealed LVEF 55-60% and no abnormalities.  Ms. Ryans continues to struggle with palpitations.  They have been worse lately and worse when lying in bed for hours.  The symptoms can last for hours.    When she has palpitations it is hard for her to catch her breath.  She also reports constant chest pressure and shortness of breath.  It is sometimes hard for her to take a deep breath in or out.  She has been less active lately because she has been dealing with flares of her rheumatoid arthritis.  2 months ago she was going to the gym regularly and ran in the treadmill or bicycle for 45 minutes and had no exertional symptoms.  She denies lower extremity edema, orthopnea, or PND.  She also has not had any syncope.  She reports that her anxiety has been poorly controlled.  She and her husband are still trying to have a baby.   Past Medical History:  Diagnosis Date  . Arthritis   . Atypical chest pain 05/14/2016  . Palpitations 11/27/2015  . Rheumatoid arthritis (HCC) 11/27/2015  . Shortness of breath 11/27/2015  . Tobacco abuse 11/27/2015    History reviewed. No pertinent surgical history.   Current Outpatient Medications  Medication Sig Dispense Refill  .  fluticasone (FLONASE) 50 MCG/ACT nasal spray SPRAY 1 SPRAY INTO EACH NOSTRIL EVERY DAY    . folic acid (FOLVITE) 1 MG tablet Take 1 tablet by mouth daily.    Marland Kitchen HYDROcodone-acetaminophen (NORCO/VICODIN) 5-325 MG tablet Take 1 tablet by mouth as needed.    . Tofacitinib Citrate (XELJANZ XR) 11 MG TB24 Take 1 tablet by mouth daily.    . naproxen sodium (ALEVE) 220 MG tablet Take 1 tablet by mouth as needed.    . Vitamin D, Ergocalciferol, (DRISDOL) 50000 units CAPS capsule Take 1 capsule by mouth once a week.     No current facility-administered medications for this visit.     Allergies:   Doxycycline    Social History:  The patient  reports that she has been smoking.  She has never used smokeless tobacco.   Family History:  The patient's family history includes Hypertension in her mother; Migraines in her mother.    ROS:  Please see the history of present illness.   Otherwise, review of systems are positive for allodynia.   All other systems are reviewed and negative.    PHYSICAL EXAM: VS:  BP 92/70   Pulse 73   Ht 5\' 2"  (1.575 m)   Wt 183 lb 12.8 oz (83.4 kg)   BMI 33.62 kg/m  , BMI Body mass  index is 33.62 kg/m. GENERAL:  Well appearing HEENT: Pupils equal round and reactive, fundi not visualized, oral mucosa unremarkable NECK:  No jugular venous distention, waveform within normal limits, carotid upstroke brisk and symmetric, no bruits LUNGS:  Clear to auscultation bilaterally HEART:  RRR.  PMI not displaced or sustained,S1 and S2 within normal limits, no S3, no S4, no clicks, no rubs, no murmurs ABD:  Flat, positive bowel sounds normal in frequency in pitch, no bruits, no rebound, no guarding, no midline pulsatile mass, no hepatomegaly, no splenomegaly EXT:  2 plus pulses throughout, no edema, no cyanosis no clubbing SKIN:  No rashes no nodules NEURO:  Cranial nerves II through XII grossly intact, motor grossly intact throughout PSYCH:  Cognitively intact, oriented to person  place and time   EKG:  EKG is ordered today. The ekg ordered 11/25/15 demonstrates sinus rhythm rate 89 bpm.   09/11/17: Sinus rhythm.  Rate 73 bpm.     Recent Labs: No results found for requested labs within last 8760 hours.  09/16/15: WBC 12.9, hemoglobin 9.7, hematocrit 32.5, platelets 431 Sodium 137, potassium 4.8, BUN 9, creatinine 0.56 AST 31, ALT 40 TSH within normal limits  48 Hour Holter Monitor 12/12/15:  Quality: Fair.  Baseline artifact. Predominant rhythm: Sinus rhythm Average heart rate: 95 bpm Min heart rate: 50 bpm Max heart rate: 170 bpm  Rare PACs and PVCs  Short runs of atrial tachycardia   Echo 12/12/15: Study Conclusions  - Left ventricle: The cavity size was normal. Systolic function was   normal. The estimated ejection fraction was in the range of 55%   to 60%. Wall motion was normal; there were no regional wall   motion abnormalities. Left ventricular diastolic function   parameters were normal.  Lipid Panel No results found for: CHOL, TRIG, HDL, CHOLHDL, VLDL, LDLCALC, LDLDIRECT    Wt Readings from Last 3 Encounters:  09/11/17 183 lb 12.8 oz (83.4 kg)  05/14/16 182 lb 9.6 oz (82.8 kg)  02/03/16 180 lb 3.2 oz (81.7 kg)      ASSESSMENT AND PLAN:  # Palpitations:  # Shortness of breath:  48 hour Holter showed occasional PACs and PVCs.  BP is too low for beta blockers.  She is hesitant to try medications for anxiety or palpitations because of family planning.    # Chest pain: Symptoms are atypical but have been ongoing for a long time and RA increases the risk of CAD.  We will get an ETT to rule out ischemia.    # Tobacco abuse: Continued to encourage smoking cessation.  She is down to 1 cigarette per day.  Current medicines are reviewed at length with the patient today.  The patient does not have concerns regarding medicines.  The following changes have been made:  no change  Labs/ tests ordered today include:   Orders Placed This  Encounter  Procedures  . Exercise Tolerance Test  . EKG 12-Lead     Disposition:   FU with Taylee Gunnells C. Duke Salvia, MD, [facility] in 3 months.       Signed, Irfan Veal C. Duke Salvia, MD, [facility]  09/13/2017 6:36 PM    [facility]

## 2017-09-11 NOTE — Patient Instructions (Signed)
Medication Instructions: Your physician recommends that you continue on your current medications as directed.    If you need a refill on your cardiac medications before your next appointment, please call your pharmacy.     Procedures/Testing: Your physician has requested that you have an exercise tolerance test. For further information please visit [email or web address] Please also follow instruction sheet, as given.    Follow-Up: Your physician wants you to follow-up in 3 months with Dr. Duke Salvia Special Instructions:    Thank you for choosing [facility] at [facility]!!

## 2017-09-13 ENCOUNTER — Encounter: Payer: Self-pay | Admitting: Cardiovascular Disease

## 2017-09-13 ENCOUNTER — Telehealth (HOSPITAL_COMMUNITY): Payer: Self-pay

## 2017-09-13 NOTE — Telephone Encounter (Signed)
Encounter complete. 

## 2017-09-18 ENCOUNTER — Ambulatory Visit (HOSPITAL_COMMUNITY)
Admission: RE | Admit: 2017-09-18 | Discharge: 2017-09-18 | Disposition: A | Discharge status: Home / Self Care | Payer: 59 | Source: Ambulatory Visit | Attending: Cardiology | Admitting: Cardiology

## 2017-09-18 DIAGNOSIS — R079 Chest pain, unspecified: Secondary | ICD-10-CM | POA: Insufficient documentation

## 2017-09-18 LAB — EXERCISE TOLERANCE TEST
CSEPEDS: 44 s
CSEPHR: 91 %
Estimated workload: 5.4 METS
Exercise duration (min): 3 min
MPHR: 189 {beats}/min
Peak HR: 173 {beats}/min
RPE: 18
Rest HR: 106 {beats}/min

## 2017-09-24 ENCOUNTER — Telehealth: Payer: Self-pay | Admitting: *Deleted

## 2017-09-24 DIAGNOSIS — R079 Chest pain, unspecified: Secondary | ICD-10-CM

## 2017-09-24 DIAGNOSIS — Z01812 Encounter for preprocedural laboratory examination: Secondary | ICD-10-CM

## 2017-09-24 DIAGNOSIS — R9439 Abnormal result of other cardiovascular function study: Secondary | ICD-10-CM

## 2017-09-24 NOTE — Telephone Encounter (Signed)
Spoke with patient and she is trying to get pregnant. She is want to know if Dr Duke Salvia would recommend Cardiac CT or her starting medication as discussed at recent visit. Will forward to Dr Duke Salvia for review

## 2017-09-24 NOTE — Telephone Encounter (Signed)
-----   Message from Chilton Si, MD sent at 09/20/2017  9:23 PM EDT ----- Poor exercise tolerance and mildly abnormal.  Recommend getting a coronary CT-A to better assess the coronaries.

## 2017-09-25 NOTE — Telephone Encounter (Signed)
Coronary CT.  Just get a beta HCG before the study.

## 2017-09-26 NOTE — Addendum Note (Signed)
Addended by: Regis Bill B on: 09/26/2017 06:06 PM   Modules accepted: Orders

## 2017-09-26 NOTE — Telephone Encounter (Signed)
Left message to call back  

## 2017-09-27 DIAGNOSIS — N911 Secondary amenorrhea: Secondary | ICD-10-CM | POA: Diagnosis not present

## 2017-10-01 ENCOUNTER — Encounter: Payer: Self-pay | Admitting: Cardiovascular Disease

## 2017-10-01 NOTE — Telephone Encounter (Signed)
This encounter was created in error - please disregard.

## 2017-10-01 NOTE — Telephone Encounter (Signed)
New Message: ° ° ° ° ° ° °Pt is returning a call °

## 2017-10-01 NOTE — Telephone Encounter (Signed)
Advised patient, verbalized understanding  

## 2017-10-28 ENCOUNTER — Telehealth: Payer: Self-pay | Admitting: *Deleted

## 2017-10-28 ENCOUNTER — Other Ambulatory Visit: Payer: Self-pay | Admitting: *Deleted

## 2017-10-28 DIAGNOSIS — Z01812 Encounter for preprocedural laboratory examination: Secondary | ICD-10-CM | POA: Diagnosis not present

## 2017-10-28 DIAGNOSIS — R079 Chest pain, unspecified: Secondary | ICD-10-CM | POA: Diagnosis not present

## 2017-10-28 DIAGNOSIS — R9439 Abnormal result of other cardiovascular function study: Secondary | ICD-10-CM | POA: Diagnosis not present

## 2017-10-28 MED ORDER — METOPROLOL TARTRATE 50 MG PO TABS: 50.0000 mg | ORAL_TABLET | Freq: Once | ORAL | 0 refills | Status: DC

## 2017-10-28 NOTE — Telephone Encounter (Signed)
PATIENT WALKED IN FOR LABS FOR  CARDIAC CTA SCHEDULE FOR 8/13  PATIENT STATES SHE DID NOT HAVE INSTRUCTIONS   WENT OVER  INSTRUCTIONS  ORDER METOPROLOL TARTRATE 50 MG  WILL CALL PATIENT IN MORNING  - WITH TODAY'S  PRELIMINARY LABS RESULT PRIOR TO TEST TOMORROW.  PATIENT VOICE UNDERSTANDING

## 2017-10-28 NOTE — Progress Notes (Signed)
METOPROLOL TARTRATE 50 MG   ORDER FOR CARDAIC CTA SCHEDULE FOR 10/29/17

## 2017-10-29 ENCOUNTER — Ambulatory Visit (HOSPITAL_COMMUNITY)
Admission: RE | Admit: 2017-10-29 | Discharge: 2017-10-29 | Disposition: A | Discharge status: Home / Self Care | Payer: 59 | Source: Ambulatory Visit | Attending: Cardiovascular Disease | Admitting: Cardiovascular Disease

## 2017-10-29 ENCOUNTER — Ambulatory Visit (HOSPITAL_COMMUNITY): Admission: RE | Admit: 2017-10-29 | Payer: 59 | Source: Ambulatory Visit

## 2017-10-29 DIAGNOSIS — R0789 Other chest pain: Secondary | ICD-10-CM

## 2017-10-29 DIAGNOSIS — H9203 Otalgia, bilateral: Secondary | ICD-10-CM | POA: Diagnosis not present

## 2017-10-29 DIAGNOSIS — R079 Chest pain, unspecified: Secondary | ICD-10-CM | POA: Diagnosis not present

## 2017-10-29 DIAGNOSIS — R9439 Abnormal result of other cardiovascular function study: Secondary | ICD-10-CM | POA: Diagnosis not present

## 2017-10-29 DIAGNOSIS — R51 Headache: Secondary | ICD-10-CM | POA: Diagnosis not present

## 2017-10-29 DIAGNOSIS — M069 Rheumatoid arthritis, unspecified: Secondary | ICD-10-CM | POA: Diagnosis not present

## 2017-10-29 LAB — BASIC METABOLIC PANEL
BUN/Creatinine Ratio: 15 (ref 9–23)
BUN: 9 mg/dL (ref 6–20)
CALCIUM: 9.4 mg/dL (ref 8.7–10.2)
CO2: 20 mmol/L (ref 20–29)
CREATININE: 0.59 mg/dL (ref 0.57–1.00)
Chloride: 103 mmol/L (ref 96–106)
GFR calc non Af Amer: 123 mL/min/{1.73_m2} (ref >59)
GFR, EST AFRICAN AMERICAN: 141 mL/min/{1.73_m2} (ref >59)
Glucose: 83 mg/dL (ref 65–99)
Potassium: 4.4 mmol/L (ref 3.5–5.2)
Sodium: 139 mmol/L (ref 134–144)

## 2017-10-29 LAB — HCG, SERUM, QUALITATIVE: hCG,Beta Subunit,Qual,Serum: NEGATIVE m[IU]/mL (ref <6)

## 2017-10-29 MED ORDER — METOPROLOL TARTRATE 5 MG/5ML IV SOLN
5.0000 mg | INTRAVENOUS | Status: DC | PRN
  Administered 2017-10-29 (×2): 5 mg via INTRAVENOUS
  Filled 2017-10-29 (×3): qty 5

## 2017-10-29 MED ORDER — NITROGLYCERIN 0.4 MG SL SUBL
0.8000 mg | SUBLINGUAL_TABLET | Freq: Once | SUBLINGUAL | Status: AC
  Administered 2017-10-29: 0.8 mg via SUBLINGUAL
  Filled 2017-10-29: qty 25

## 2017-10-29 MED ORDER — IOPAMIDOL (ISOVUE-370) INJECTION 76%
100.0000 mL | Freq: Once | INTRAVENOUS | Status: AC | PRN
  Administered 2017-10-29: 100 mL via INTRAVENOUS

## 2017-10-29 MED ORDER — METOPROLOL TARTRATE 5 MG/5ML IV SOLN
INTRAVENOUS | Status: AC
  Filled 2017-10-29: qty 10

## 2017-10-29 MED ORDER — NITROGLYCERIN 0.4 MG SL SUBL
SUBLINGUAL_TABLET | SUBLINGUAL | Status: AC
  Filled 2017-10-29: qty 2

## 2017-10-29 NOTE — Telephone Encounter (Signed)
Preliminary lab results given - bmp -- All level in normal limit  and negative HCG. Patient aware to proceed with Cardiac CTA

## 2017-10-31 DIAGNOSIS — N970 Female infertility associated with anovulation: Secondary | ICD-10-CM | POA: Diagnosis not present

## 2017-11-04 ENCOUNTER — Telehealth: Payer: Self-pay | Admitting: Cardiology

## 2017-11-04 ENCOUNTER — Telehealth: Payer: Self-pay

## 2017-11-04 NOTE — Telephone Encounter (Signed)
New message  Patient is calling to get CT results. Please advise.    Pt c/o of Chest Pain: STAT if CP now or developed within 24 hours  1. Are you having CP right now? yes  2. Are you experiencing any other symptoms (ex. SOB, nausea, vomiting, sweating)? Pain, tightness,   3. How long have you been experiencing CP? 2 hours  4. Is your CP continuous or coming and going? Coming and going  5. Have you taken Nitroglycerin? No    Patient is also calling to get CT results. Please advise.  ?

## 2017-11-04 NOTE — Telephone Encounter (Signed)
Spoke to patient who called to get her Chest CT results.  She also said that she has been having 1-2 out of 10 chest pain lately.  She also mentioned that she feels a pounding in her chest, but has not checked her HR or BP.  I informed her on how to check her HR at the radial site, which she will do and keep [place] updated.

## 2017-11-04 NOTE — Telephone Encounter (Signed)
Notes recorded by Chilton Si, MD on 11/04/2017 at 4:57 PM EDT Coronary arteries are completely normal.        patient aware and verbalized understanding.  She states her chest pain has resolved.  She states she continues to have issues with palpitations.  This occurs daily, mainly in the evenings and at night.   She becomes SOB with the palpitations.  Reports caffeine intake is rare.     Per chart review:  # Palpitations:  # Shortness of breath:  48 hour Holter showed occasional PACs and PVCs.  BP is too low for beta blockers.  She is hesitant to try medications for anxiety or palpitations because of family planning.     Advised to make sure she is staying hydrated and would make Dr. Duke Salvia aware of continued issues with palpitations.

## 2017-11-08 NOTE — Telephone Encounter (Signed)
BP is too low for beta blockers.  Antiarrhythmics are not advised as she is trying to get pregnant.  These are not dangerous.  Agree with hydration and deep breathing techniques when she has them.

## 2017-11-08 NOTE — Telephone Encounter (Signed)
Advised patient, verbalized understanding  

## 2017-11-11 DIAGNOSIS — Z3201 Encounter for pregnancy test, result positive: Secondary | ICD-10-CM | POA: Diagnosis not present

## 2017-11-11 DIAGNOSIS — Z3A01 Less than 8 weeks gestation of pregnancy: Secondary | ICD-10-CM | POA: Diagnosis not present

## 2017-11-11 DIAGNOSIS — R3915 Urgency of urination: Secondary | ICD-10-CM | POA: Diagnosis not present

## 2017-11-11 DIAGNOSIS — O0900 Supervision of pregnancy with history of infertility, unspecified trimester: Secondary | ICD-10-CM | POA: Diagnosis not present

## 2017-11-14 ENCOUNTER — Telehealth: Payer: Self-pay | Admitting: Cardiovascular Disease

## 2017-11-14 DIAGNOSIS — O0900 Supervision of pregnancy with history of infertility, unspecified trimester: Secondary | ICD-10-CM | POA: Diagnosis not present

## 2017-11-14 NOTE — Telephone Encounter (Signed)
Left message to call back  

## 2017-11-14 NOTE — Telephone Encounter (Signed)
Follow up ° ° °Patient is returning call. Please call. °

## 2017-11-14 NOTE — Telephone Encounter (Signed)
° ° °  Spouse (dpr) calling for lab results

## 2017-11-14 NOTE — Telephone Encounter (Signed)
Pt aware of lab results  Notes recorded by Chilton Si, MD on 11/14/2017 at 5:45 AM EDT Normal kidney function. Negative pregnancy test.  Pt sts that she is currently [redacted] weeks pregnant. Update fwd to Dr.[place]

## 2017-11-20 DIAGNOSIS — Z3A01 Less than 8 weeks gestation of pregnancy: Secondary | ICD-10-CM | POA: Diagnosis not present

## 2017-11-20 DIAGNOSIS — O26851 Spotting complicating pregnancy, first trimester: Secondary | ICD-10-CM | POA: Diagnosis not present

## 2017-11-20 DIAGNOSIS — O0900 Supervision of pregnancy with history of infertility, unspecified trimester: Secondary | ICD-10-CM | POA: Diagnosis not present

## 2017-11-22 DIAGNOSIS — O26851 Spotting complicating pregnancy, first trimester: Secondary | ICD-10-CM | POA: Diagnosis not present

## 2017-11-22 DIAGNOSIS — Z3201 Encounter for pregnancy test, result positive: Secondary | ICD-10-CM | POA: Diagnosis not present

## 2017-11-22 DIAGNOSIS — Z3A01 Less than 8 weeks gestation of pregnancy: Secondary | ICD-10-CM | POA: Diagnosis not present

## 2017-11-26 DIAGNOSIS — O039 Complete or unspecified spontaneous abortion without complication: Secondary | ICD-10-CM | POA: Diagnosis not present

## 2017-12-03 DIAGNOSIS — O039 Complete or unspecified spontaneous abortion without complication: Secondary | ICD-10-CM | POA: Diagnosis not present

## 2017-12-04 DIAGNOSIS — Z3A01 Less than 8 weeks gestation of pregnancy: Secondary | ICD-10-CM | POA: Diagnosis not present

## 2017-12-04 DIAGNOSIS — O209 Hemorrhage in early pregnancy, unspecified: Secondary | ICD-10-CM | POA: Diagnosis not present

## 2017-12-05 DIAGNOSIS — O021 Missed abortion: Secondary | ICD-10-CM | POA: Diagnosis not present

## 2017-12-05 DIAGNOSIS — O039 Complete or unspecified spontaneous abortion without complication: Secondary | ICD-10-CM | POA: Diagnosis not present

## 2017-12-06 ENCOUNTER — Encounter (HOSPITAL_COMMUNITY): Payer: Self-pay | Admitting: Obstetrics & Gynecology

## 2017-12-06 ENCOUNTER — Inpatient Hospital Stay (HOSPITAL_COMMUNITY)
Admission: AD | Admit: 2017-12-06 | Discharge: 2017-12-06 | Disposition: A | Discharge status: Home / Self Care | Payer: 59 | Source: Ambulatory Visit | Attending: Obstetrics and Gynecology | Admitting: Obstetrics and Gynecology

## 2017-12-06 ENCOUNTER — Other Ambulatory Visit: Payer: Self-pay

## 2017-12-06 DIAGNOSIS — O009 Unspecified ectopic pregnancy without intrauterine pregnancy: Secondary | ICD-10-CM | POA: Insufficient documentation

## 2017-12-06 DIAGNOSIS — F17210 Nicotine dependence, cigarettes, uncomplicated: Secondary | ICD-10-CM | POA: Insufficient documentation

## 2017-12-06 DIAGNOSIS — O3680X0 Pregnancy with inconclusive fetal viability, not applicable or unspecified: Secondary | ICD-10-CM

## 2017-12-06 HISTORY — DX: Pregnancy with inconclusive fetal viability, not applicable or unspecified: O36.80X0

## 2017-12-06 LAB — CREATININE, SERUM
Creatinine, Ser: 0.57 mg/dL (ref 0.44–1.00)
GFR calc Af Amer: >60 mL/min (ref >60)
GFR calc non Af Amer: >60 mL/min (ref >60)

## 2017-12-06 LAB — CBC WITH DIFFERENTIAL/PLATELET
BASOS ABS: 0 10*3/uL (ref 0.0–0.1)
BASOS PCT: 0 %
EOS ABS: 0.3 10*3/uL (ref 0.0–0.7)
EOS PCT: 3 %
HCT: 33.9 % — ABNORMAL LOW (ref 36.0–46.0)
Hemoglobin: 10.4 g/dL — ABNORMAL LOW (ref 12.0–15.0)
Lymphocytes Relative: 22 %
Lymphs Abs: 2.2 10*3/uL (ref 0.7–4.0)
MCH: 22.4 pg — ABNORMAL LOW (ref 26.0–34.0)
MCHC: 30.7 g/dL (ref 30.0–36.0)
MCV: 73.1 fL — ABNORMAL LOW (ref 78.0–100.0)
Monocytes Absolute: 0.3 10*3/uL (ref 0.1–1.0)
Monocytes Relative: 3 %
Neutro Abs: 7.1 10*3/uL (ref 1.7–7.7)
Neutrophils Relative %: 72 %
PLATELETS: 358 10*3/uL (ref 150–400)
RBC: 4.64 MIL/uL (ref 3.87–5.11)
RDW: 18.2 % — ABNORMAL HIGH (ref 11.5–15.5)
WBC: 9.9 10*3/uL (ref 4.0–10.5)

## 2017-12-06 LAB — BUN: BUN: 9 mg/dL (ref 6–20)

## 2017-12-06 LAB — AST: AST: 24 U/L (ref 15–41)

## 2017-12-06 LAB — HCG, QUANTITATIVE, PREGNANCY: hCG, Beta Chain, Quant, S: 173 m[IU]/mL — ABNORMAL HIGH (ref <5)

## 2017-12-06 MED ORDER — METHOTREXATE INJECTION FOR WOMEN'S HOSPITAL
50.0000 mg/m2 | Freq: Once | INTRAMUSCULAR | Status: AC
  Administered 2017-12-06: 95 mg via INTRAMUSCULAR
  Filled 2017-12-06: qty 1.9

## 2017-12-06 NOTE — MAU Provider Note (Signed)
  History     CSN: [id number]  Arrival date and time: 12/06/17 1244   None     Chief Complaint  Patient presents with  . methotrexate injection   HPI Pt sent from office for methotrexate for abnormal quant rise and no villi noted on office D&C for presumed ectopic.  Labs reviewed, quant HCG has dropped but considering D&C path was neg for villi/ IUP, possibility of ectopic was discussed and methotrexate was reviewed and recommended and patient agreed.     Past Medical History:  Diagnosis Date  . Arthritis   . Atypical chest pain 05/14/2016  . Palpitations 11/27/2015  . Pregnancy of unknown anatomic location 12/06/2017  . Rheumatoid arthritis (HCC) 11/27/2015  . Shortness of breath 11/27/2015  . Tobacco abuse 11/27/2015    No past surgical history on file.  Family History  Problem Relation Age of Onset  . Hypertension Mother   . Migraines Mother     Social History   Tobacco Use  . Smoking status: Light Tobacco Smoker  . Smokeless tobacco: Never Used  Substance Use Topics  . Alcohol use: Not on file  . Drug use: Not on file    Allergies:  Allergies  Allergen Reactions  . Doxycycline Rash    Ulcers-skin    Medications Prior to Admission  Medication Sig Dispense Refill Last Dose  . acetaminophen (TYLENOL) 325 MG tablet Take 650 mg by mouth every 4 (four) hours as needed.   Past Week at Unknown time  . folic acid (FOLVITE) 1 MG tablet Take 1 tablet by mouth daily.   Past Month at Unknown time  . Tofacitinib Citrate (XELJANZ XR) 11 MG TB24 Take 1 tablet by mouth daily.   Past Week at Unknown time  . fluticasone (FLONASE) 50 MCG/ACT nasal spray SPRAY 1 SPRAY INTO EACH NOSTRIL EVERY DAY   More than a month at Unknown time  . HYDROcodone-acetaminophen (NORCO/VICODIN) 5-325 MG tablet Take 1 tablet by mouth as needed.   More than a month at Unknown time  . metoprolol tartrate (LOPRESSOR) 50 MG tablet Take 1 tablet (50 mg total) by mouth once for 1 dose. TAKE ONE HOUR PRIOR  TO  SCHEDULE CARDAIC TEST 1 tablet 0   . naproxen sodium (ALEVE) 220 MG tablet Take 1 tablet by mouth as needed.   More than a month at Unknown time  . Vitamin D, Ergocalciferol, (DRISDOL) 50000 units CAPS capsule Take 1 capsule by mouth once a week.   More than a month at Unknown time    Review of Systems Physical Exam   Blood pressure 107/86, pulse 91, temperature 98.5 F (36.9 C), temperature source Oral, resp. rate 17, height 5\' 2"  (1.575 m), weight 85.4 kg, last menstrual period 09/21/2017, SpO2 100 %.  Physical Exam  MAU Course  Procedures  Methotrexate injection   Assessment and Plan  Pregnancy suspected early ectopic pregnancy Methotrexate now. Labs in office. F/up Dr 11/22/2017 Juliene Pina 9/27 but labs before that.  Quant D4 - Mon 9/23 D7 Thurs 9/26  Ajit Errico R Chrislyn Seedorf 12/06/2017, 2:12 PM

## 2017-12-06 NOTE — MAU Note (Addendum)
Pt sent in for methotrexate.  Had d &c in office, no chorionic villi found.  Some cramping, heavier bleeding today.

## 2017-12-10 DIAGNOSIS — Z3A01 Less than 8 weeks gestation of pregnancy: Secondary | ICD-10-CM | POA: Diagnosis not present

## 2017-12-12 ENCOUNTER — Ambulatory Visit: Payer: 59 | Admitting: Cardiovascular Disease

## 2017-12-20 DIAGNOSIS — Z3A01 Less than 8 weeks gestation of pregnancy: Secondary | ICD-10-CM | POA: Diagnosis not present

## 2017-12-26 DIAGNOSIS — H1013 Acute atopic conjunctivitis, bilateral: Secondary | ICD-10-CM | POA: Diagnosis not present

## 2017-12-26 DIAGNOSIS — O029 Abnormal product of conception, unspecified: Secondary | ICD-10-CM | POA: Diagnosis not present

## 2017-12-27 DIAGNOSIS — R309 Painful micturition, unspecified: Secondary | ICD-10-CM | POA: Diagnosis not present

## 2017-12-27 DIAGNOSIS — Z79899 Other long term (current) drug therapy: Secondary | ICD-10-CM | POA: Diagnosis not present

## 2017-12-27 DIAGNOSIS — M059 Rheumatoid arthritis with rheumatoid factor, unspecified: Secondary | ICD-10-CM | POA: Diagnosis not present

## 2018-01-03 DIAGNOSIS — L0292 Furuncle, unspecified: Secondary | ICD-10-CM | POA: Diagnosis not present

## 2018-01-06 DIAGNOSIS — M069 Rheumatoid arthritis, unspecified: Secondary | ICD-10-CM | POA: Diagnosis not present

## 2018-01-06 DIAGNOSIS — M059 Rheumatoid arthritis with rheumatoid factor, unspecified: Secondary | ICD-10-CM | POA: Diagnosis not present

## 2018-01-06 DIAGNOSIS — M4692 Unspecified inflammatory spondylopathy, cervical region: Secondary | ICD-10-CM | POA: Diagnosis not present

## 2018-01-23 DIAGNOSIS — R05 Cough: Secondary | ICD-10-CM | POA: Diagnosis not present

## 2018-01-23 DIAGNOSIS — M069 Rheumatoid arthritis, unspecified: Secondary | ICD-10-CM | POA: Diagnosis not present

## 2018-01-23 DIAGNOSIS — D508 Other iron deficiency anemias: Secondary | ICD-10-CM | POA: Diagnosis not present

## 2018-01-23 DIAGNOSIS — M25571 Pain in right ankle and joints of right foot: Secondary | ICD-10-CM | POA: Diagnosis not present

## 2018-03-03 DIAGNOSIS — M059 Rheumatoid arthritis with rheumatoid factor, unspecified: Secondary | ICD-10-CM | POA: Diagnosis not present

## 2018-03-03 DIAGNOSIS — Z79899 Other long term (current) drug therapy: Secondary | ICD-10-CM | POA: Diagnosis not present

## 2018-03-20 DIAGNOSIS — M059 Rheumatoid arthritis with rheumatoid factor, unspecified: Secondary | ICD-10-CM | POA: Diagnosis not present

## 2018-03-20 DIAGNOSIS — G4701 Insomnia due to medical condition: Secondary | ICD-10-CM | POA: Diagnosis not present

## 2018-04-21 DIAGNOSIS — M7751 Other enthesopathy of right foot: Secondary | ICD-10-CM | POA: Diagnosis not present

## 2018-04-21 DIAGNOSIS — Z79899 Other long term (current) drug therapy: Secondary | ICD-10-CM | POA: Diagnosis not present

## 2018-04-21 DIAGNOSIS — M059 Rheumatoid arthritis with rheumatoid factor, unspecified: Secondary | ICD-10-CM | POA: Diagnosis not present

## 2018-05-01 DIAGNOSIS — M79671 Pain in right foot: Secondary | ICD-10-CM | POA: Diagnosis not present

## 2018-05-01 DIAGNOSIS — M19071 Primary osteoarthritis, right ankle and foot: Secondary | ICD-10-CM | POA: Diagnosis not present

## 2018-05-01 DIAGNOSIS — M25571 Pain in right ankle and joints of right foot: Secondary | ICD-10-CM | POA: Diagnosis not present

## 2018-05-04 DIAGNOSIS — J069 Acute upper respiratory infection, unspecified: Secondary | ICD-10-CM | POA: Diagnosis not present

## 2018-05-04 DIAGNOSIS — I00 Rheumatic fever without heart involvement: Secondary | ICD-10-CM | POA: Diagnosis not present

## 2018-05-06 ENCOUNTER — Other Ambulatory Visit (HOSPITAL_COMMUNITY): Payer: Self-pay | Admitting: Respiratory Therapy

## 2018-05-06 DIAGNOSIS — R05 Cough: Secondary | ICD-10-CM

## 2018-05-06 DIAGNOSIS — R059 Cough, unspecified: Secondary | ICD-10-CM

## 2018-05-12 ENCOUNTER — Inpatient Hospital Stay (HOSPITAL_COMMUNITY): Admission: RE | Admit: 2018-05-12 | Payer: 59 | Source: Ambulatory Visit

## 2018-05-14 DIAGNOSIS — M25571 Pain in right ankle and joints of right foot: Secondary | ICD-10-CM | POA: Diagnosis not present

## 2018-05-14 DIAGNOSIS — M19071 Primary osteoarthritis, right ankle and foot: Secondary | ICD-10-CM | POA: Diagnosis not present

## 2018-05-15 ENCOUNTER — Inpatient Hospital Stay (HOSPITAL_COMMUNITY): Admission: RE | Admit: 2018-05-15 | Payer: 59 | Source: Ambulatory Visit

## 2018-05-16 ENCOUNTER — Ambulatory Visit (HOSPITAL_COMMUNITY)
Admission: RE | Admit: 2018-05-16 | Discharge: 2018-05-16 | Disposition: A | Discharge status: Home / Self Care | Payer: 59 | Source: Ambulatory Visit | Attending: Family Medicine | Admitting: Family Medicine

## 2018-05-16 DIAGNOSIS — R059 Cough, unspecified: Secondary | ICD-10-CM

## 2018-05-16 DIAGNOSIS — R05 Cough: Secondary | ICD-10-CM | POA: Insufficient documentation

## 2018-05-16 LAB — SPIROMETRY WITH GRAPH
FEF 25-75 Pre: 4.5 L/sec
FEF2575-%Pred-Pre: 137 %
FEV1-%Pred-Pre: 101 %
FEV1-Pre: 3.01 L
FEV1FVC-%Pred-Pre: 106 %
FEV6-%Pred-Pre: 95 %
FEV6-Pre: 3.34 L
FEV6FVC-%Pred-Pre: 99 %
FVC-%Pred-Pre: 95 %
FVC-Pre: 3.38 L
Pre FEV1/FVC ratio: 89 %
Pre FEV6/FVC Ratio: 99 %

## 2018-05-27 DIAGNOSIS — N644 Mastodynia: Secondary | ICD-10-CM | POA: Diagnosis not present

## 2018-05-27 DIAGNOSIS — N3941 Urge incontinence: Secondary | ICD-10-CM | POA: Diagnosis not present

## 2018-05-27 DIAGNOSIS — R350 Frequency of micturition: Secondary | ICD-10-CM | POA: Diagnosis not present

## 2018-05-29 ENCOUNTER — Other Ambulatory Visit: Payer: Self-pay | Admitting: Obstetrics and Gynecology

## 2018-05-29 DIAGNOSIS — R0981 Nasal congestion: Secondary | ICD-10-CM | POA: Diagnosis not present

## 2018-05-29 DIAGNOSIS — R5381 Other malaise: Secondary | ICD-10-CM

## 2018-05-29 DIAGNOSIS — K219 Gastro-esophageal reflux disease without esophagitis: Secondary | ICD-10-CM | POA: Diagnosis not present

## 2018-05-29 DIAGNOSIS — H9202 Otalgia, left ear: Secondary | ICD-10-CM | POA: Diagnosis not present

## 2018-06-02 ENCOUNTER — Other Ambulatory Visit: Payer: Self-pay | Admitting: Obstetrics and Gynecology

## 2018-06-02 ENCOUNTER — Other Ambulatory Visit: Payer: Self-pay | Admitting: Obstetrics & Gynecology

## 2018-06-02 DIAGNOSIS — N644 Mastodynia: Secondary | ICD-10-CM

## 2018-06-06 ENCOUNTER — Other Ambulatory Visit: Payer: 59

## 2018-06-15 ENCOUNTER — Encounter (HOSPITAL_COMMUNITY): Payer: Self-pay | Admitting: Emergency Medicine

## 2018-06-15 ENCOUNTER — Emergency Department (HOSPITAL_COMMUNITY): Payer: 59

## 2018-06-15 ENCOUNTER — Emergency Department (HOSPITAL_COMMUNITY)
Admission: EM | Admit: 2018-06-15 | Discharge: 2018-06-15 | Disposition: A | Discharge status: Home / Self Care | Payer: 59 | Attending: Emergency Medicine | Admitting: Emergency Medicine

## 2018-06-15 ENCOUNTER — Other Ambulatory Visit: Payer: Self-pay

## 2018-06-15 DIAGNOSIS — F17200 Nicotine dependence, unspecified, uncomplicated: Secondary | ICD-10-CM | POA: Insufficient documentation

## 2018-06-15 DIAGNOSIS — M25511 Pain in right shoulder: Secondary | ICD-10-CM | POA: Diagnosis not present

## 2018-06-15 DIAGNOSIS — H9202 Otalgia, left ear: Secondary | ICD-10-CM | POA: Diagnosis not present

## 2018-06-15 DIAGNOSIS — Z79899 Other long term (current) drug therapy: Secondary | ICD-10-CM | POA: Diagnosis not present

## 2018-06-15 DIAGNOSIS — M542 Cervicalgia: Secondary | ICD-10-CM | POA: Diagnosis not present

## 2018-06-15 LAB — I-STAT BETA HCG BLOOD, ED (MC, WL, AP ONLY): I-stat hCG, quantitative: <5 m[IU]/mL (ref <5)

## 2018-06-15 MED ORDER — OXYCODONE-ACETAMINOPHEN 5-325 MG PO TABS: 1.0000 | ORAL_TABLET | ORAL | 0 refills | Status: DC | PRN

## 2018-06-15 MED ORDER — NAPROXEN SODIUM 220 MG PO TABS: 220.0000 mg | ORAL_TABLET | Freq: Two times a day (BID) | ORAL | 0 refills | Status: AC

## 2018-06-15 MED ORDER — MORPHINE SULFATE (PF) 4 MG/ML IV SOLN
4.0000 mg | Freq: Once | INTRAVENOUS | Status: AC
  Administered 2018-06-15: 4 mg via INTRAVENOUS
  Filled 2018-06-15: qty 1

## 2018-06-15 MED ORDER — SODIUM CHLORIDE (PF) 0.9 % IJ SOLN
INTRAMUSCULAR | Status: AC
  Filled 2018-06-15: qty 50

## 2018-06-15 MED ORDER — KETOROLAC TROMETHAMINE 15 MG/ML IJ SOLN
15.0000 mg | Freq: Once | INTRAMUSCULAR | Status: AC
  Administered 2018-06-15: 15 mg via INTRAVENOUS
  Filled 2018-06-15: qty 1

## 2018-06-15 MED ORDER — IOHEXOL 300 MG/ML  SOLN
100.0000 mL | Freq: Once | INTRAMUSCULAR | Status: AC | PRN
  Administered 2018-06-15: 100 mL via INTRAVENOUS

## 2018-06-15 NOTE — ED Triage Notes (Signed)
Patient is complaining of left ear pain and right shoulder pain that started yesterday. Patient states the ear pain is also making her mouth dry. No other symptoms.

## 2018-06-15 NOTE — Discharge Instructions (Addendum)
Please make an appointment with your doctor for next week to have your shoulder pain and left ear pain rechecked.   If you develop new or worsening symptoms that are concerning for emergent medical condition, return to the ED for further evaluation and management.

## 2018-06-15 NOTE — ED Provider Notes (Signed)
[facility]-EMERGENCY DEPT Provider Note   CSN: [id number] Arrival date & time: 06/15/18  0250    History   Chief Complaint Chief Complaint  Patient presents with  . Otalgia  . Shoulder Pain  . Possible Pregnancy    HPI Sandra Sosa is a 33 y.o. female.     Patient with history of RA presents with complaint of severe right shoulder pain since yesterday. She reports pain with any movement. No noted swelling or redness. No neck pain or pain distal to the shoulder. No injury. No numbness or weakness. She also has sharp severe pain in her left ear. She finished a 10-day course of Amoxicillin yesterday for an ear infection diagnosed by her PCP. She reports the pain has been persistent without getting better or worse. No change in her hearing. She denies fever, congestion, sore throat, dental pain, painful or difficult swallowing.   The history is provided by the patient. No language interpreter was used.  Otalgia  Associated symptoms: no congestion, no fever, no hearing loss, no neck pain, no sore throat and no vomiting   Shoulder Pain  Associated symptoms: no fever and no neck pain   Possible Pregnancy     Past Medical History:  Diagnosis Date  . Arthritis   . Atypical chest pain 05/14/2016  . Palpitations 11/27/2015  . Pregnancy of unknown anatomic location 12/06/2017  . Rheumatoid arthritis (HCC) 11/27/2015  . Shortness of breath 11/27/2015  . Tobacco abuse 11/27/2015    Patient Active Problem List   Diagnosis Date Noted  . Pregnancy of unknown anatomic location 12/06/2017  . Atypical chest pain 05/14/2016  . Rheumatoid arthritis (HCC) 11/27/2015  . Shortness of breath 11/27/2015  . Tobacco abuse 11/27/2015  . Palpitations 11/27/2015    History reviewed. No pertinent surgical history.   OB History   No obstetric history on file.      Home Medications    Prior to Admission medications   Medication Sig Start Date End Date Taking? Authorizing  Provider  acetaminophen (TYLENOL) 325 MG tablet Take 650 mg by mouth every 4 (four) hours as needed.    [provider]  fluticasone (FLONASE) 50 MCG/ACT nasal spray SPRAY 1 SPRAY INTO EACH NOSTRIL EVERY DAY 10/15/16   [provider]  HYDROcodone-acetaminophen (NORCO/VICODIN) 5-325 MG tablet Take 1 tablet by mouth as needed. 11/16/16   [provider]  metoprolol tartrate (LOPRESSOR) 50 MG tablet Take 1 tablet (50 mg total) by mouth once for 1 dose. TAKE ONE HOUR PRIOR TO  SCHEDULE CARDAIC TEST 10/28/17 10/28/17  Chilton Si, MD  naproxen sodium (ALEVE) 220 MG tablet Take 1 tablet by mouth as needed.    [provider]  Tofacitinib Citrate (XELJANZ XR) 11 MG TB24 Take 1 tablet by mouth daily. 04/25/17 04/25/18  [provider]  Vitamin D, Ergocalciferol, (DRISDOL) 50000 units CAPS capsule Take 1 capsule by mouth once a week.    [provider]    Family History Family History  Problem Relation Age of Onset  . Hypertension Mother   . Migraines Mother     Social History Social History   Tobacco Use  . Smoking status: Light Tobacco Smoker  . Smokeless tobacco: Never Used  Substance Use Topics  . Alcohol use: Not on file  . Drug use: Not on file     Allergies   Doxycycline   Review of Systems Review of Systems  Constitutional: Negative for chills and fever.  HENT: Positive for  ear pain. Negative for congestion, dental problem, hearing loss, sinus pressure, sinus pain, sore throat and trouble swallowing.   Respiratory: Negative.   Cardiovascular: Negative.   Gastrointestinal: Negative.  Negative for nausea and vomiting.  Musculoskeletal: Negative for neck pain.       See HPI.  Skin: Negative.  Negative for color change.  Neurological: Negative.  Negative for weakness and numbness.     Physical Exam Updated Vital Signs BP 121/65 (BP Location: Left Arm)   Pulse (!) 105   Temp 98.7 F (37.1 C) (Oral)   Resp 16   Ht 5'  2" (1.575 m)   Wt 86.2 kg   SpO2 100%   BMI 34.75 kg/m   Physical Exam Constitutional:      Appearance: She is well-developed.  HENT:     Head: Normocephalic.     Left Ear: Tympanic membrane, ear canal and external ear normal.     Nose: Nose normal.     Mouth/Throat:     Mouth: Mucous membranes are moist.     Pharynx: Oropharynx is clear.     Comments: Good dentition without visualized abscess or significant caries. Neck:     Musculoskeletal: Normal range of motion and neck supple. No muscular tenderness.  Pulmonary:     Effort: Pulmonary effort is normal.  Abdominal:     General: Bowel sounds are normal.     Palpations: Abdomen is soft.     Tenderness: There is no abdominal tenderness. There is no guarding or rebound.  Musculoskeletal: Normal range of motion.     Comments: Right upper extremity held in adduction, internally rotated. FROM hand with normal grip strength. Nontender elbow. Right shoulder has no bony deformity, redness, warmth or swelling. Tender across superior border and deltoid.  Lymphadenopathy:     Cervical: No cervical adenopathy.  Skin:    General: Skin is warm and dry.     Findings: No rash.  Neurological:     Mental Status: She is alert and oriented to person, place, and time.     Sensory: No sensory deficit.      ED Treatments / Results  Labs (all labs ordered are listed, but only abnormal results are displayed) Labs Reviewed  I-STAT BETA HCG BLOOD, ED ([facility], [facility], [facility] ONLY)    EKG None  Radiology No results found.  Procedures Procedures (including critical care time)  Medications Ordered in ED Medications  morphine 4 MG/ML injection 4 mg (4 mg Intravenous Given 06/15/18 0358)  ketorolac (TORADOL) 15 MG/ML injection 15 mg (15 mg Intravenous Given 06/15/18 0357)     Initial Impression / Assessment and Plan / ED Course  I have reviewed the triage vital signs and the nursing notes.  Pertinent labs & imaging results that were available  during my care of the patient were reviewed by me and considered in my medical decision making (see chart for details).        Patient with left ear pain unchanged with antibiotic treatment of otitis. NO fever, hearing change. Also has severe right shoulder pain without injury.   History of RA, on plaquenil per patient. No fever but considered immunocompromised. Recent ear infection diagnosed by PCP. Will CT soft tissue neck to insure no deep infection.  Imaging of the right shoulder is negative for visualized effusion, bony abnormality. No redness, warmth or swelling - doubt septic joint.   Pain is improved with IV Morphine, Toradol.   CT show ?trace mastoid effusion on left. Doubt acute  or lingering infection given no fever.   The patient is felt appropriate for discharge home. She feels comfortable with plan of discharge. She is encouraged to make an appointment with her doctor for next week to have her pain rechecked to insure she is improving.   Final Clinical Impressions(s) / ED Diagnoses   Final diagnoses:  None   1. Right shoulder pain 2. Left ear pain  ED Discharge Orders    None       Elpidio Anis, PA-C 06/15/18 1700    Geoffery Lyons, MD 06/15/18 [phone]

## 2018-06-15 NOTE — ED Notes (Signed)
Patient transported to X-ray 

## 2018-08-06 ENCOUNTER — Other Ambulatory Visit: Payer: 59

## 2019-01-30 ENCOUNTER — Ambulatory Visit: Payer: 59 | Admitting: Registered"

## 2019-02-18 ENCOUNTER — Ambulatory Visit: Payer: 59 | Admitting: Registered"

## 2019-05-19 DIAGNOSIS — H6980 Other specified disorders of Eustachian tube, unspecified ear: Secondary | ICD-10-CM | POA: Diagnosis not present

## 2019-05-28 DIAGNOSIS — H9201 Otalgia, right ear: Secondary | ICD-10-CM | POA: Diagnosis not present

## 2019-05-28 DIAGNOSIS — J3489 Other specified disorders of nose and nasal sinuses: Secondary | ICD-10-CM | POA: Diagnosis not present

## 2019-06-30 DIAGNOSIS — M059 Rheumatoid arthritis with rheumatoid factor, unspecified: Secondary | ICD-10-CM | POA: Diagnosis not present

## 2019-06-30 DIAGNOSIS — Z79899 Other long term (current) drug therapy: Secondary | ICD-10-CM | POA: Diagnosis not present

## 2019-06-30 DIAGNOSIS — R768 Other specified abnormal immunological findings in serum: Secondary | ICD-10-CM | POA: Diagnosis not present

## 2019-06-30 DIAGNOSIS — R700 Elevated erythrocyte sedimentation rate: Secondary | ICD-10-CM | POA: Diagnosis not present

## 2019-06-30 DIAGNOSIS — R5383 Other fatigue: Secondary | ICD-10-CM | POA: Diagnosis not present

## 2019-07-02 DIAGNOSIS — E282 Polycystic ovarian syndrome: Secondary | ICD-10-CM | POA: Diagnosis not present

## 2019-07-07 DIAGNOSIS — Z79899 Other long term (current) drug therapy: Secondary | ICD-10-CM | POA: Diagnosis not present

## 2019-07-07 DIAGNOSIS — R768 Other specified abnormal immunological findings in serum: Secondary | ICD-10-CM | POA: Diagnosis not present

## 2019-07-07 DIAGNOSIS — M059 Rheumatoid arthritis with rheumatoid factor, unspecified: Secondary | ICD-10-CM | POA: Diagnosis not present

## 2019-08-11 DIAGNOSIS — M059 Rheumatoid arthritis with rheumatoid factor, unspecified: Secondary | ICD-10-CM | POA: Diagnosis not present

## 2019-08-30 IMAGING — CT CT NECK WITH CONTRAST
3 of 4 series · 13 of 33 positions shown, 16 images · IV contrast (omnipaque)
Comparison: None available.

CLINICAL DATA: Initial evaluation for left-sided neck pain and
swelling, recent ear infection, evaluate for abscess.
Immunocompromised.

EXAM:
CT NECK WITH CONTRAST
TECHNIQUE: Multidetector CT imaging of the neck was performed using the
standard protocol following the bolus administration of intravenous
contrast.
CONTRAST:  100mL OMNIPAQUE IOHEXOL 300 MG/ML  SOLN

[Series 2: axial neck · axial · 0.53mm/px · z∈[-235,-85]mm · 5 of 113 slices shown, 7 images]
[im 19/113  soft-tissue]
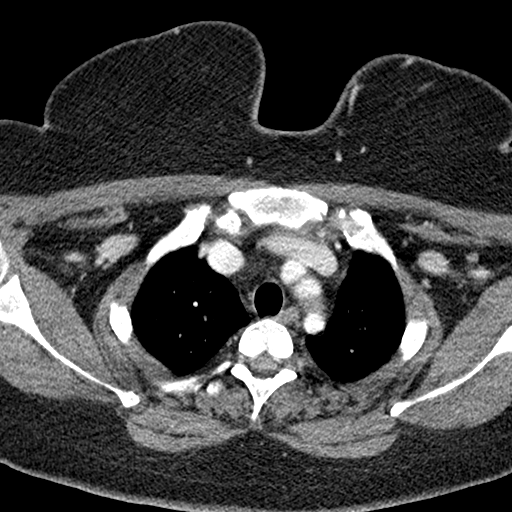
[im 19/113  bone]
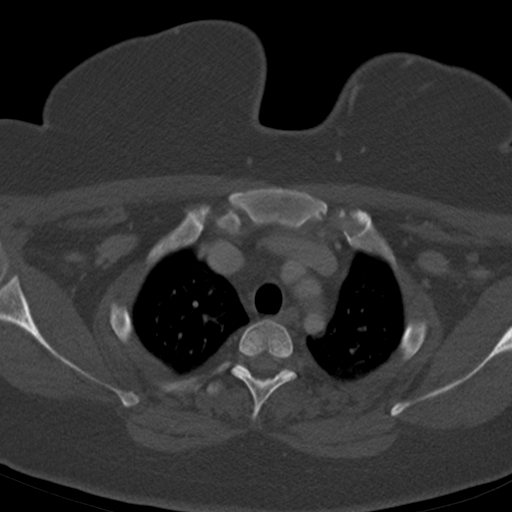
[im 38/113  bone]
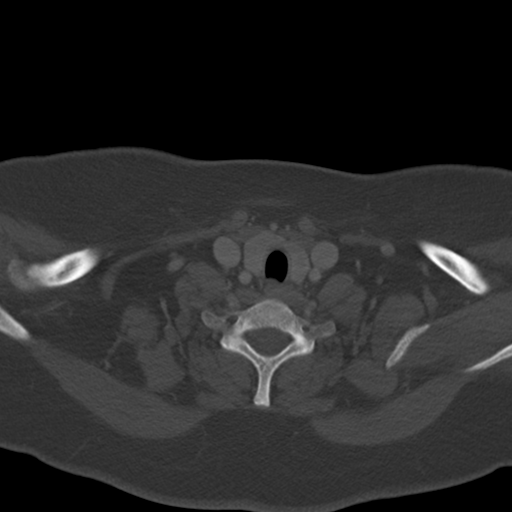
[im 57/113  bone]
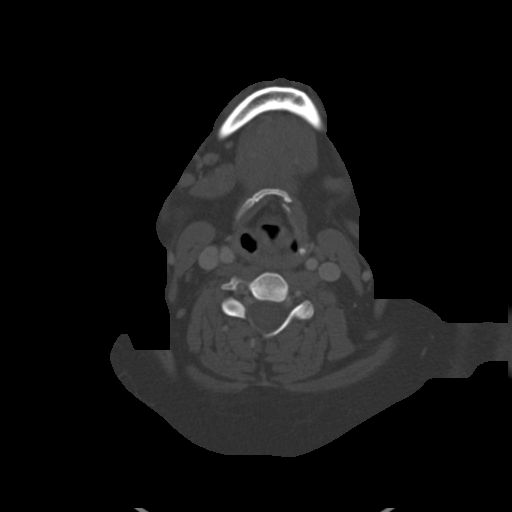
[im 75/113  bone]
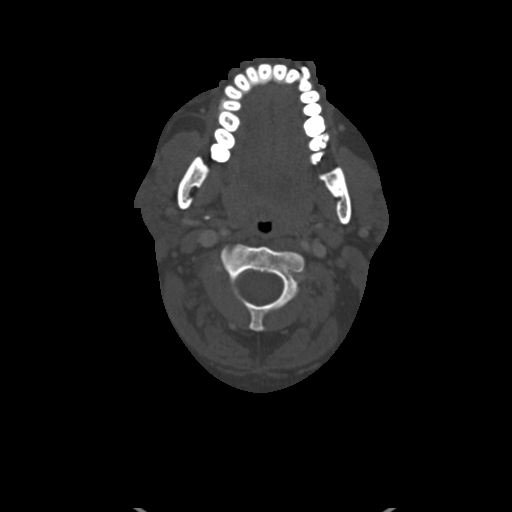
[im 94/113  soft-tissue]
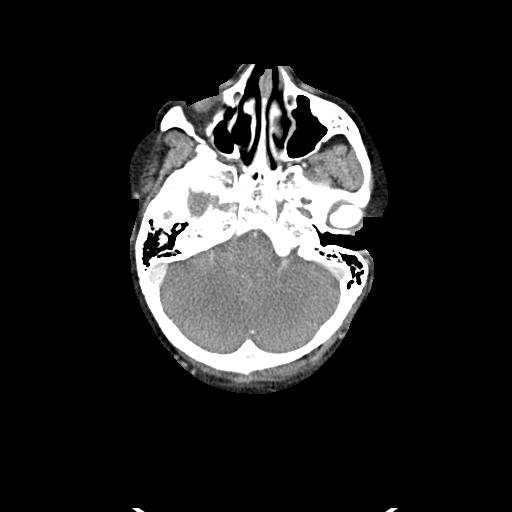
[im 94/113  bone]
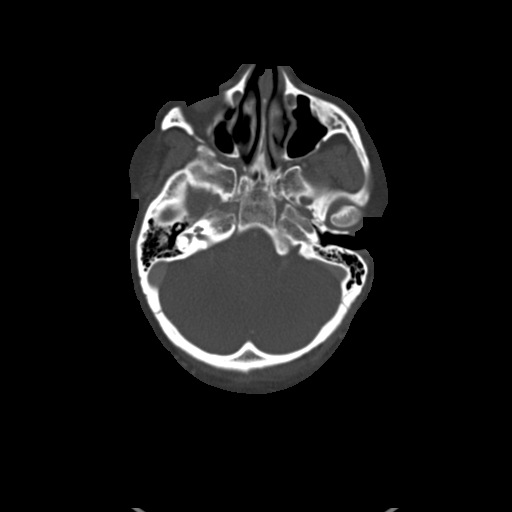

[Series 7: cor neck · coronal · 0.49mm/px · 3 of 120 slices shown]
[im 28/120  bone]
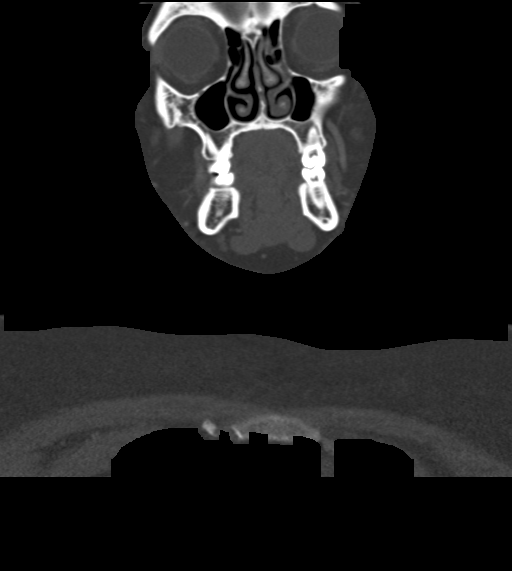
[im 49/120  bone]
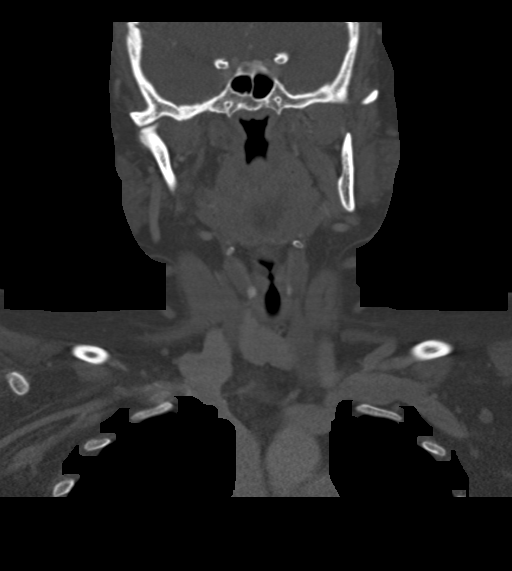
[im 71/120  bone]
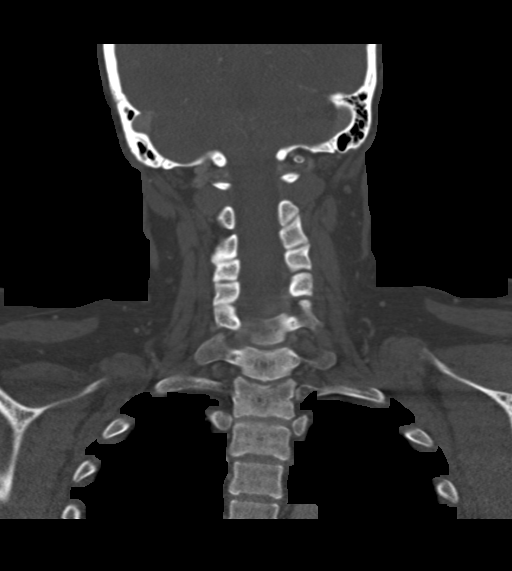

[Series 8: sag neck · sagittal · 0.53mm/px · 5 of 101 slices shown, 6 images]
[im 34/101  bone]
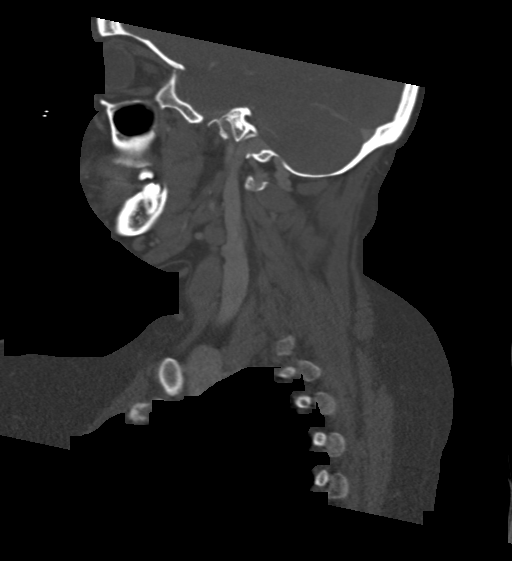
[im 42/101  bone]
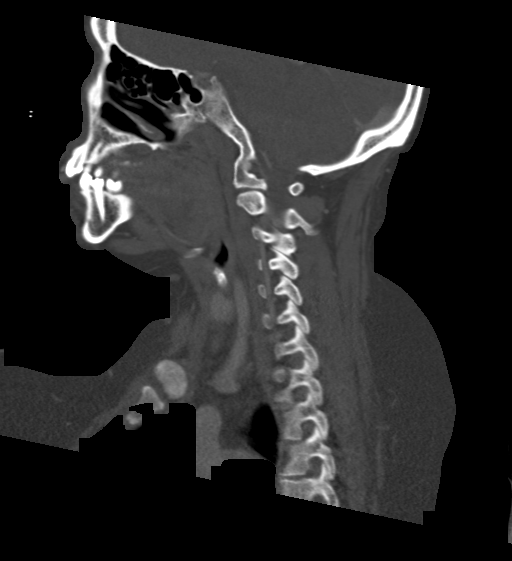
[im 51/101  soft-tissue]
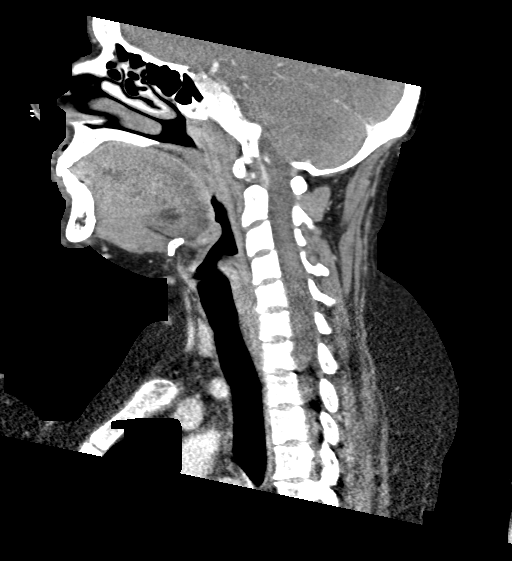
[im 51/101  bone]
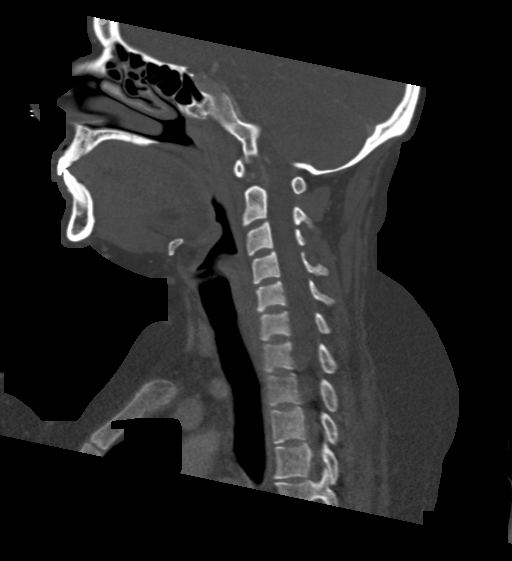
[im 59/101  bone]
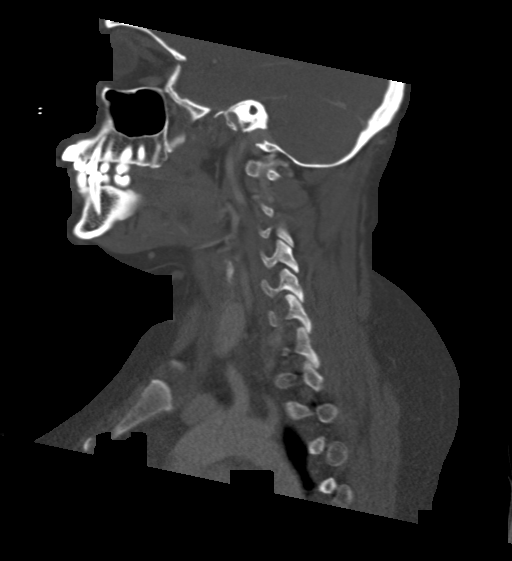
[im 67/101  bone]
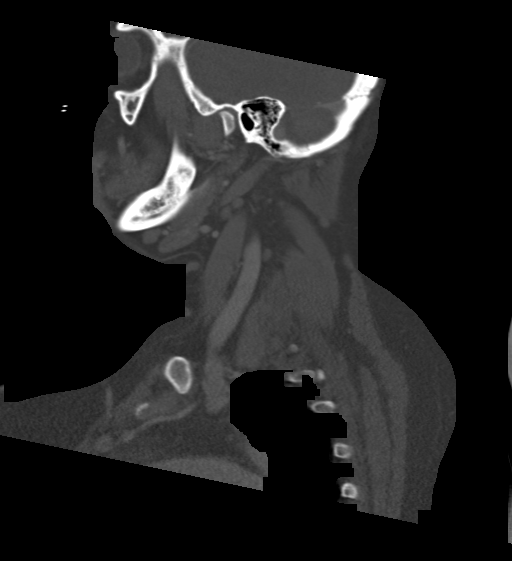

[13 of 33 positions shown; findings below may reference images not displayed]

FINDINGS: Pharynx and larynx: Oral cavity within normal limits without mass
lesion or loculated collection. No acute abnormality about the
dentition. Palatine tonsils symmetric and within normal limits. Few
punctate calcified tonsilliths noted. No discrete tonsillar or
peritonsillar abscess. Parapharyngeal fat maintained. Nasopharynx
and oropharynx within normal limits. No retropharyngeal collection.
Epiglottis normal. Vallecula largely effaced by the lingual tonsils.
Remainder of the hypopharynx and supraglottic larynx within normal
limits. True cords symmetric and normal. Subglottic airway clear.

Salivary glands: Salivary glands including the parotid and
submandibular glands are normal.

Thyroid: Few small subcentimeter left thyroid nodules noted, of
doubtful significance. Thyroid otherwise unremarkable.

Lymph nodes: No pathologically enlarged lymph nodes identified
within the neck.

Vascular: Normal intravascular enhancement seen throughout the neck.

Limited intracranial: Unremarkable.

Visualized orbits: Unremarkable.

Mastoids and visualized paranasal sinuses: Mild mucosal thickening
within the ethmoidal air cells. Paranasal sinuses are otherwise
clear without evidence for acute sinusitis. Trace effusion noted
within the inferior left mastoid air cells. Mastoid air cells are
otherwise clear bilaterally. Middle ear cavities are well
pneumatized and free of fluid.

Skeleton: No acute osseous abnormality. No discrete lytic or blastic
osseous lesions. Atlantooccipital assimilation noted.

Upper chest: Visualized upper chest demonstrates no acute finding.
Minimal atelectatic changes noted within the lungs.

Other: None.
IMPRESSION: 1. No CT evidence for acute abnormality within the neck. No acute
inflammatory changes, collection, or other finding.
2. Trace left mastoid effusion, of uncertain significance, and may
reflect the residua of recent infection given history. Correlation
with physical exam recommended.

## 2019-08-30 IMAGING — CR RIGHT SHOULDER - 2+ VIEW
2 series · 2 of 2 positions shown · non-contrast
Comparison: None.

CLINICAL DATA: Right shoulder pain

EXAM:
RIGHT SHOULDER - 2+ VIEW

[x shoulder ap right (1 of 2)]
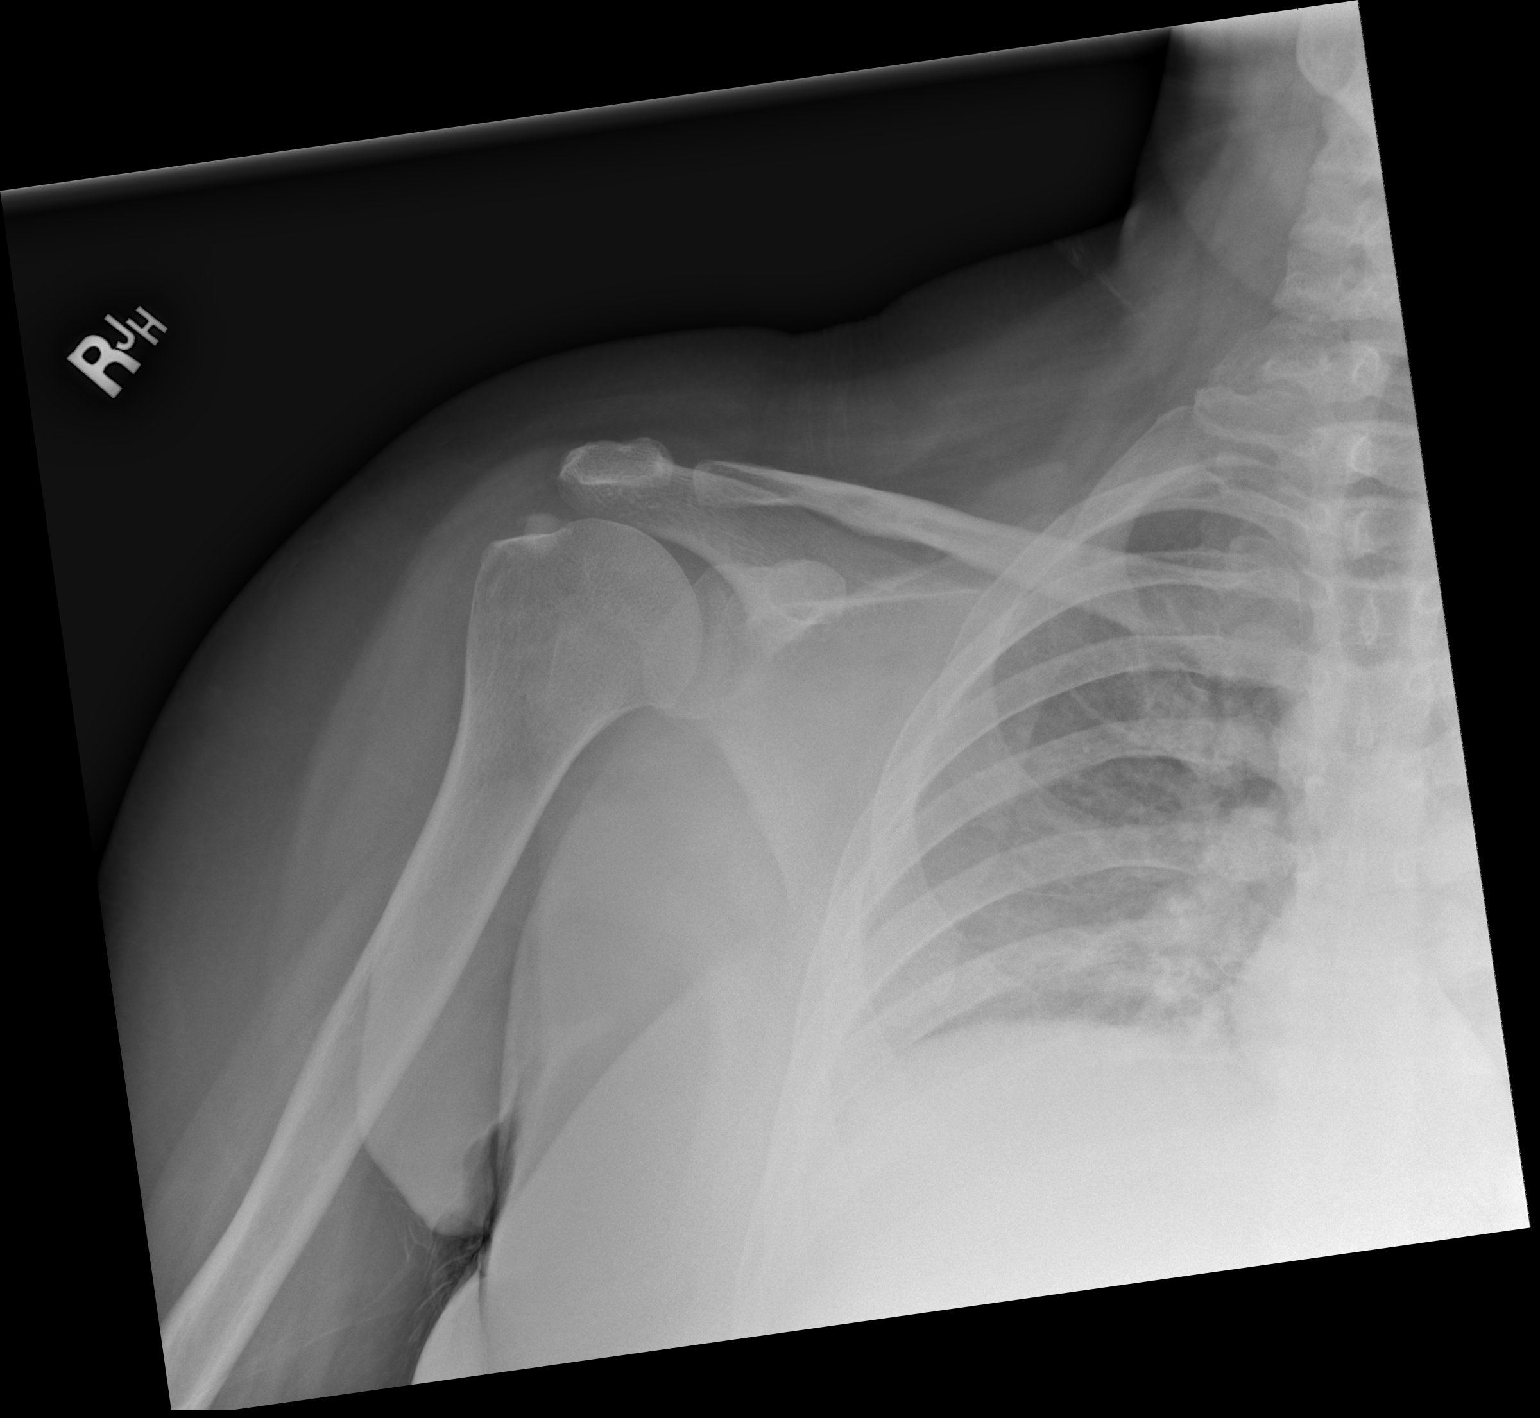

[x shoulder ap right (2 of 2)]
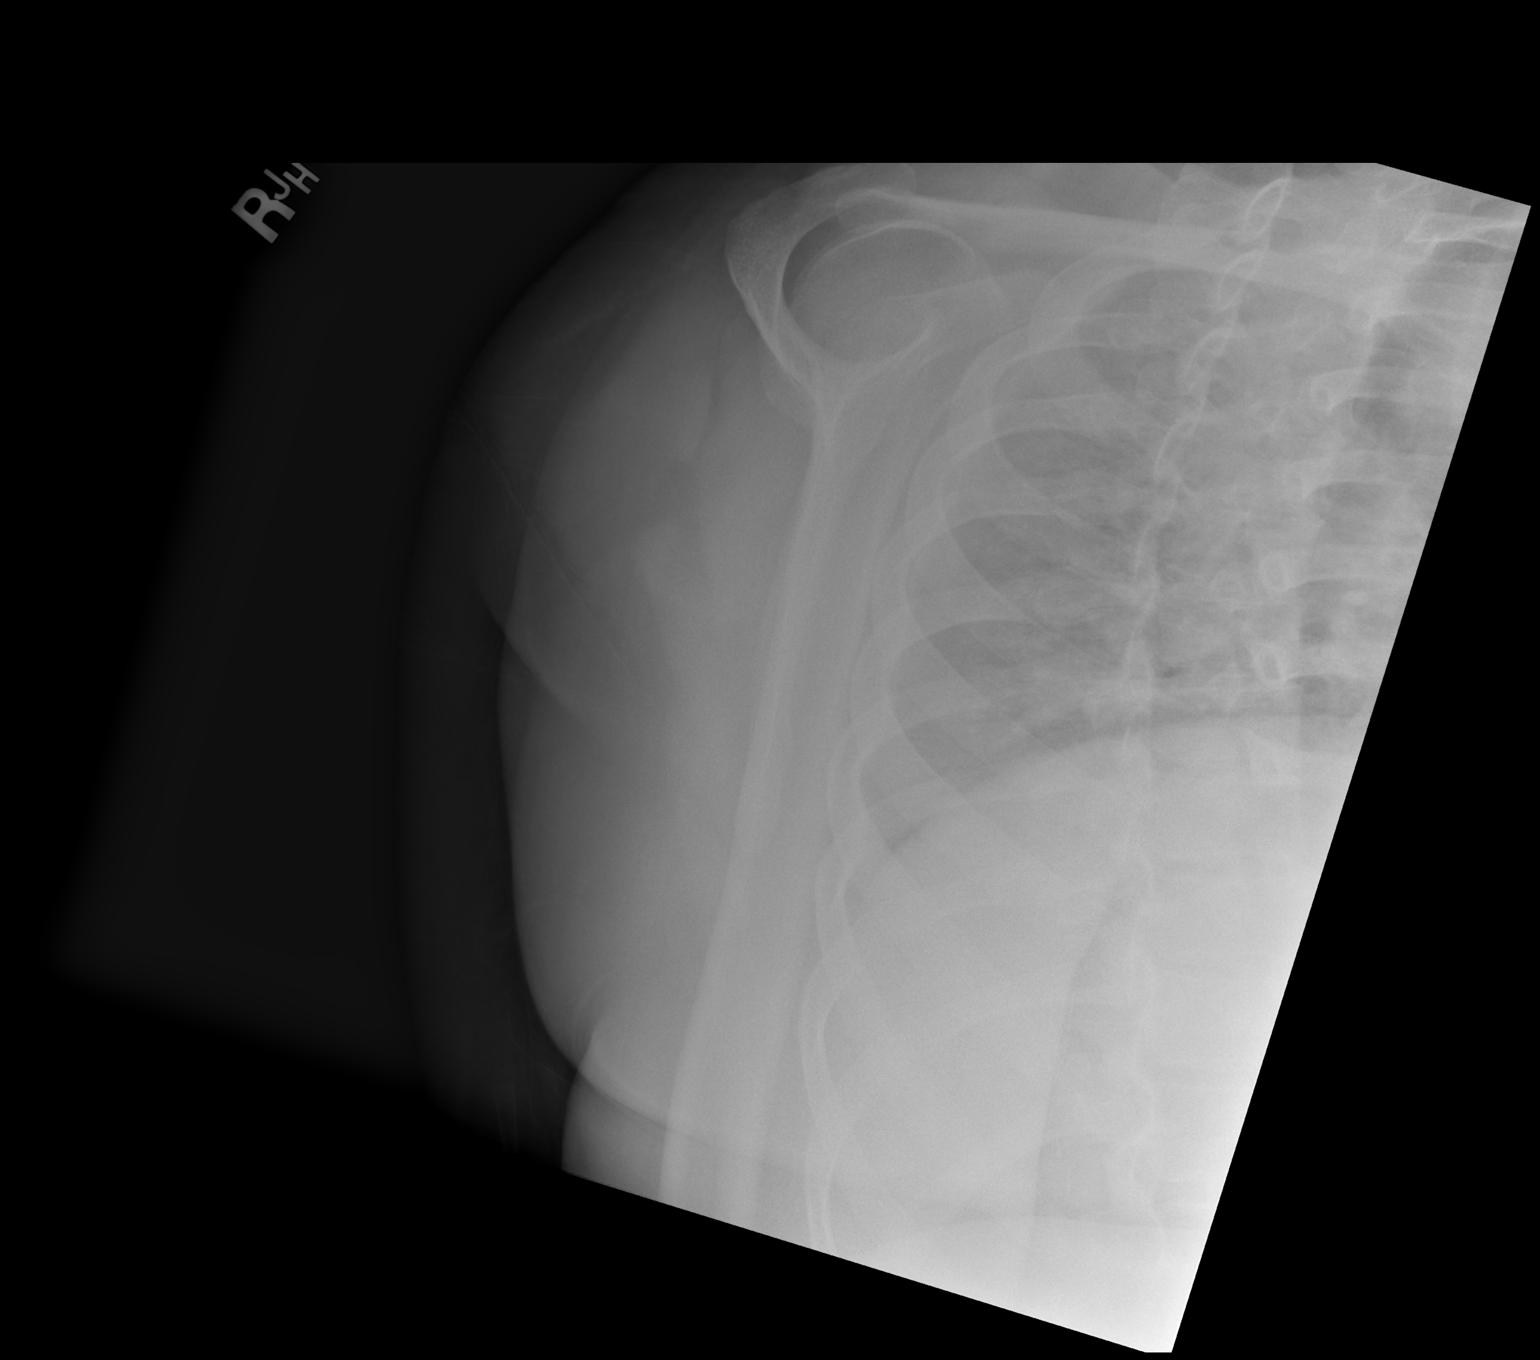

[2 of 2 positions shown; findings below may reference images not displayed]

FINDINGS: No fracture or dislocation is seen.

The joint spaces are preserved.

The visualized soft tissues are unremarkable.

Visualized right lung is clear.
IMPRESSION: Negative.

## 2019-09-09 DIAGNOSIS — M069 Rheumatoid arthritis, unspecified: Secondary | ICD-10-CM | POA: Diagnosis not present

## 2019-09-09 DIAGNOSIS — L989 Disorder of the skin and subcutaneous tissue, unspecified: Secondary | ICD-10-CM | POA: Diagnosis not present

## 2019-09-09 DIAGNOSIS — M059 Rheumatoid arthritis with rheumatoid factor, unspecified: Secondary | ICD-10-CM | POA: Diagnosis not present

## 2019-09-09 DIAGNOSIS — G2581 Restless legs syndrome: Secondary | ICD-10-CM | POA: Diagnosis not present

## 2019-09-09 DIAGNOSIS — R000 Tachycardia, unspecified: Secondary | ICD-10-CM | POA: Diagnosis not present

## 2019-09-09 DIAGNOSIS — G629 Polyneuropathy, unspecified: Secondary | ICD-10-CM | POA: Diagnosis not present

## 2019-09-09 DIAGNOSIS — Z79899 Other long term (current) drug therapy: Secondary | ICD-10-CM | POA: Diagnosis not present

## 2019-09-09 DIAGNOSIS — R0600 Dyspnea, unspecified: Secondary | ICD-10-CM | POA: Diagnosis not present

## 2019-11-17 DIAGNOSIS — N979 Female infertility, unspecified: Secondary | ICD-10-CM | POA: Diagnosis not present

## 2019-11-17 DIAGNOSIS — Z3169 Encounter for other general counseling and advice on procreation: Secondary | ICD-10-CM | POA: Diagnosis not present

## 2019-12-25 DIAGNOSIS — N979 Female infertility, unspecified: Secondary | ICD-10-CM | POA: Diagnosis not present

## 2020-01-18 DIAGNOSIS — N979 Female infertility, unspecified: Secondary | ICD-10-CM | POA: Diagnosis not present

## 2020-01-18 DIAGNOSIS — M059 Rheumatoid arthritis with rheumatoid factor, unspecified: Secondary | ICD-10-CM | POA: Diagnosis not present

## 2020-01-18 DIAGNOSIS — R300 Dysuria: Secondary | ICD-10-CM | POA: Diagnosis not present

## 2020-01-18 DIAGNOSIS — Z319 Encounter for procreative management, unspecified: Secondary | ICD-10-CM | POA: Diagnosis not present

## 2020-01-18 DIAGNOSIS — Z79899 Other long term (current) drug therapy: Secondary | ICD-10-CM | POA: Diagnosis not present

## 2020-03-29 DIAGNOSIS — D649 Anemia, unspecified: Secondary | ICD-10-CM | POA: Diagnosis not present

## 2020-03-29 DIAGNOSIS — Z79899 Other long term (current) drug therapy: Secondary | ICD-10-CM | POA: Diagnosis not present

## 2020-03-29 DIAGNOSIS — M059 Rheumatoid arthritis with rheumatoid factor, unspecified: Secondary | ICD-10-CM | POA: Diagnosis not present

## 2020-03-29 DIAGNOSIS — Z319 Encounter for procreative management, unspecified: Secondary | ICD-10-CM | POA: Diagnosis not present

## 2020-05-18 DIAGNOSIS — F410 Panic disorder [episodic paroxysmal anxiety] without agoraphobia: Secondary | ICD-10-CM | POA: Diagnosis not present

## 2020-07-04 DIAGNOSIS — Z79899 Other long term (current) drug therapy: Secondary | ICD-10-CM | POA: Diagnosis not present

## 2020-07-04 DIAGNOSIS — Z7952 Long term (current) use of systemic steroids: Secondary | ICD-10-CM | POA: Diagnosis not present

## 2020-07-04 DIAGNOSIS — F411 Generalized anxiety disorder: Secondary | ICD-10-CM | POA: Diagnosis not present

## 2020-07-04 DIAGNOSIS — M059 Rheumatoid arthritis with rheumatoid factor, unspecified: Secondary | ICD-10-CM | POA: Diagnosis not present

## 2020-07-12 ENCOUNTER — Telehealth: Payer: Self-pay | Admitting: Cardiovascular Disease

## 2020-07-12 NOTE — Telephone Encounter (Signed)
Called patient regarding returned mail status letter returned from her Provider , LVM for patient to  call and update address .

## 2020-09-08 DIAGNOSIS — Z79899 Other long term (current) drug therapy: Secondary | ICD-10-CM | POA: Diagnosis not present

## 2020-09-08 DIAGNOSIS — H04123 Dry eye syndrome of bilateral lacrimal glands: Secondary | ICD-10-CM | POA: Diagnosis not present

## 2020-09-08 DIAGNOSIS — H52203 Unspecified astigmatism, bilateral: Secondary | ICD-10-CM | POA: Diagnosis not present

## 2020-09-27 DIAGNOSIS — R059 Cough, unspecified: Secondary | ICD-10-CM | POA: Diagnosis not present

## 2020-09-27 DIAGNOSIS — M19021 Primary osteoarthritis, right elbow: Secondary | ICD-10-CM | POA: Diagnosis not present

## 2020-09-27 DIAGNOSIS — Z79899 Other long term (current) drug therapy: Secondary | ICD-10-CM | POA: Diagnosis not present

## 2020-09-27 DIAGNOSIS — M19022 Primary osteoarthritis, left elbow: Secondary | ICD-10-CM | POA: Diagnosis not present

## 2020-09-27 DIAGNOSIS — M059 Rheumatoid arthritis with rheumatoid factor, unspecified: Secondary | ICD-10-CM | POA: Diagnosis not present

## 2020-09-27 DIAGNOSIS — R7989 Other specified abnormal findings of blood chemistry: Secondary | ICD-10-CM | POA: Diagnosis not present

## 2020-09-27 DIAGNOSIS — R0989 Other specified symptoms and signs involving the circulatory and respiratory systems: Secondary | ICD-10-CM | POA: Diagnosis not present

## 2020-11-18 DIAGNOSIS — Z79899 Other long term (current) drug therapy: Secondary | ICD-10-CM | POA: Diagnosis not present

## 2020-11-18 DIAGNOSIS — M059 Rheumatoid arthritis with rheumatoid factor, unspecified: Secondary | ICD-10-CM | POA: Diagnosis not present

## 2020-12-05 DIAGNOSIS — M064 Inflammatory polyarthropathy: Secondary | ICD-10-CM | POA: Diagnosis not present

## 2021-01-09 DIAGNOSIS — Z13220 Encounter for screening for lipoid disorders: Secondary | ICD-10-CM | POA: Diagnosis not present

## 2021-01-09 DIAGNOSIS — Z0000 Encounter for general adult medical examination without abnormal findings: Secondary | ICD-10-CM | POA: Diagnosis not present

## 2021-01-09 DIAGNOSIS — E049 Nontoxic goiter, unspecified: Secondary | ICD-10-CM | POA: Diagnosis not present

## 2021-01-09 DIAGNOSIS — D508 Other iron deficiency anemias: Secondary | ICD-10-CM | POA: Diagnosis not present

## 2021-01-09 DIAGNOSIS — K640 First degree hemorrhoids: Secondary | ICD-10-CM | POA: Diagnosis not present

## 2021-01-09 DIAGNOSIS — R6883 Chills (without fever): Secondary | ICD-10-CM | POA: Diagnosis not present

## 2021-02-03 DIAGNOSIS — E282 Polycystic ovarian syndrome: Secondary | ICD-10-CM | POA: Diagnosis not present

## 2021-02-03 DIAGNOSIS — N978 Female infertility of other origin: Secondary | ICD-10-CM | POA: Diagnosis not present

## 2021-04-08 DIAGNOSIS — J40 Bronchitis, not specified as acute or chronic: Secondary | ICD-10-CM | POA: Diagnosis not present

## 2021-04-14 DIAGNOSIS — R062 Wheezing: Secondary | ICD-10-CM | POA: Diagnosis not present

## 2021-04-14 DIAGNOSIS — Z20822 Contact with and (suspected) exposure to covid-19: Secondary | ICD-10-CM | POA: Diagnosis not present

## 2021-04-14 DIAGNOSIS — J209 Acute bronchitis, unspecified: Secondary | ICD-10-CM | POA: Diagnosis not present

## 2021-04-14 DIAGNOSIS — R0981 Nasal congestion: Secondary | ICD-10-CM | POA: Diagnosis not present

## 2021-04-14 DIAGNOSIS — D509 Iron deficiency anemia, unspecified: Secondary | ICD-10-CM | POA: Diagnosis not present

## 2021-04-25 DIAGNOSIS — D509 Iron deficiency anemia, unspecified: Secondary | ICD-10-CM | POA: Diagnosis not present

## 2021-04-25 DIAGNOSIS — J209 Acute bronchitis, unspecified: Secondary | ICD-10-CM | POA: Diagnosis not present

## 2021-04-25 DIAGNOSIS — R062 Wheezing: Secondary | ICD-10-CM | POA: Diagnosis not present

## 2021-04-25 DIAGNOSIS — R0989 Other specified symptoms and signs involving the circulatory and respiratory systems: Secondary | ICD-10-CM | POA: Diagnosis not present

## 2021-05-08 DIAGNOSIS — J209 Acute bronchitis, unspecified: Secondary | ICD-10-CM | POA: Diagnosis not present

## 2021-05-08 DIAGNOSIS — R062 Wheezing: Secondary | ICD-10-CM | POA: Diagnosis not present

## 2021-05-08 DIAGNOSIS — R0989 Other specified symptoms and signs involving the circulatory and respiratory systems: Secondary | ICD-10-CM | POA: Diagnosis not present

## 2021-05-08 DIAGNOSIS — D509 Iron deficiency anemia, unspecified: Secondary | ICD-10-CM | POA: Diagnosis not present

## 2021-05-15 DIAGNOSIS — N978 Female infertility of other origin: Secondary | ICD-10-CM | POA: Diagnosis not present

## 2021-07-07 DIAGNOSIS — E282 Polycystic ovarian syndrome: Secondary | ICD-10-CM | POA: Diagnosis not present

## 2021-07-07 DIAGNOSIS — Q51810 Arcuate uterus: Secondary | ICD-10-CM | POA: Diagnosis not present

## 2021-07-07 DIAGNOSIS — N971 Female infertility of tubal origin: Secondary | ICD-10-CM | POA: Diagnosis not present

## 2021-07-31 DIAGNOSIS — Z79899 Other long term (current) drug therapy: Secondary | ICD-10-CM | POA: Diagnosis not present

## 2021-07-31 DIAGNOSIS — E559 Vitamin D deficiency, unspecified: Secondary | ICD-10-CM | POA: Diagnosis not present

## 2021-07-31 DIAGNOSIS — Z7952 Long term (current) use of systemic steroids: Secondary | ICD-10-CM | POA: Diagnosis not present

## 2021-07-31 DIAGNOSIS — M059 Rheumatoid arthritis with rheumatoid factor, unspecified: Secondary | ICD-10-CM | POA: Diagnosis not present

## 2021-08-31 DIAGNOSIS — M059 Rheumatoid arthritis with rheumatoid factor, unspecified: Secondary | ICD-10-CM | POA: Diagnosis not present

## 2021-09-12 DIAGNOSIS — K0889 Other specified disorders of teeth and supporting structures: Secondary | ICD-10-CM | POA: Diagnosis not present

## 2021-09-12 DIAGNOSIS — L259 Unspecified contact dermatitis, unspecified cause: Secondary | ICD-10-CM | POA: Diagnosis not present

## 2021-09-14 DIAGNOSIS — M059 Rheumatoid arthritis with rheumatoid factor, unspecified: Secondary | ICD-10-CM | POA: Diagnosis not present

## 2021-11-29 DIAGNOSIS — N978 Female infertility of other origin: Secondary | ICD-10-CM | POA: Diagnosis not present

## 2021-12-04 DIAGNOSIS — Z79899 Other long term (current) drug therapy: Secondary | ICD-10-CM | POA: Diagnosis not present

## 2021-12-04 DIAGNOSIS — M059 Rheumatoid arthritis with rheumatoid factor, unspecified: Secondary | ICD-10-CM | POA: Diagnosis not present

## 2021-12-04 DIAGNOSIS — K2100 Gastro-esophageal reflux disease with esophagitis, without bleeding: Secondary | ICD-10-CM | POA: Diagnosis not present

## 2022-06-11 DIAGNOSIS — M059 Rheumatoid arthritis with rheumatoid factor, unspecified: Secondary | ICD-10-CM | POA: Diagnosis not present

## 2022-06-21 DIAGNOSIS — F329 Major depressive disorder, single episode, unspecified: Secondary | ICD-10-CM | POA: Diagnosis not present

## 2022-06-21 DIAGNOSIS — F419 Anxiety disorder, unspecified: Secondary | ICD-10-CM | POA: Diagnosis not present

## 2022-06-21 DIAGNOSIS — M069 Rheumatoid arthritis, unspecified: Secondary | ICD-10-CM | POA: Diagnosis not present

## 2022-07-16 ENCOUNTER — Ambulatory Visit (HOSPITAL_BASED_OUTPATIENT_CLINIC_OR_DEPARTMENT_OTHER): Payer: Self-pay | Admitting: Cardiovascular Disease

## 2022-08-06 DIAGNOSIS — F329 Major depressive disorder, single episode, unspecified: Secondary | ICD-10-CM | POA: Diagnosis not present

## 2022-08-06 DIAGNOSIS — N912 Amenorrhea, unspecified: Secondary | ICD-10-CM | POA: Diagnosis not present

## 2022-08-06 DIAGNOSIS — F419 Anxiety disorder, unspecified: Secondary | ICD-10-CM | POA: Diagnosis not present

## 2022-08-06 DIAGNOSIS — R140 Abdominal distension (gaseous): Secondary | ICD-10-CM | POA: Diagnosis not present

## 2022-10-12 DIAGNOSIS — M059 Rheumatoid arthritis with rheumatoid factor, unspecified: Secondary | ICD-10-CM | POA: Diagnosis not present

## 2022-10-12 DIAGNOSIS — Z6833 Body mass index (BMI) 33.0-33.9, adult: Secondary | ICD-10-CM | POA: Diagnosis not present

## 2022-10-12 DIAGNOSIS — R1900 Intra-abdominal and pelvic swelling, mass and lump, unspecified site: Secondary | ICD-10-CM | POA: Diagnosis not present

## 2022-10-12 DIAGNOSIS — F4323 Adjustment disorder with mixed anxiety and depressed mood: Secondary | ICD-10-CM | POA: Diagnosis not present

## 2022-10-12 DIAGNOSIS — R1013 Epigastric pain: Secondary | ICD-10-CM | POA: Diagnosis not present

## 2022-10-15 ENCOUNTER — Other Ambulatory Visit: Payer: Self-pay | Admitting: Family Medicine

## 2022-10-15 DIAGNOSIS — R1900 Intra-abdominal and pelvic swelling, mass and lump, unspecified site: Secondary | ICD-10-CM

## 2022-10-15 DIAGNOSIS — R1013 Epigastric pain: Secondary | ICD-10-CM

## 2022-11-21 DIAGNOSIS — Z03818 Encounter for observation for suspected exposure to other biological agents ruled out: Secondary | ICD-10-CM | POA: Diagnosis not present

## 2022-11-21 DIAGNOSIS — J392 Other diseases of pharynx: Secondary | ICD-10-CM | POA: Diagnosis not present

## 2022-11-21 DIAGNOSIS — H6993 Unspecified Eustachian tube disorder, bilateral: Secondary | ICD-10-CM | POA: Diagnosis not present

## 2022-11-21 DIAGNOSIS — R0981 Nasal congestion: Secondary | ICD-10-CM | POA: Diagnosis not present

## 2022-12-17 DIAGNOSIS — N971 Female infertility of tubal origin: Secondary | ICD-10-CM | POA: Diagnosis not present

## 2022-12-17 DIAGNOSIS — Z3169 Encounter for other general counseling and advice on procreation: Secondary | ICD-10-CM | POA: Diagnosis not present

## 2022-12-17 DIAGNOSIS — E282 Polycystic ovarian syndrome: Secondary | ICD-10-CM | POA: Diagnosis not present

## 2022-12-17 DIAGNOSIS — N978 Female infertility of other origin: Secondary | ICD-10-CM | POA: Diagnosis not present

## 2022-12-20 DIAGNOSIS — M059 Rheumatoid arthritis with rheumatoid factor, unspecified: Secondary | ICD-10-CM | POA: Diagnosis not present

## 2022-12-20 DIAGNOSIS — Z79899 Other long term (current) drug therapy: Secondary | ICD-10-CM | POA: Diagnosis not present

## 2023-01-02 DIAGNOSIS — Z3169 Encounter for other general counseling and advice on procreation: Secondary | ICD-10-CM | POA: Diagnosis not present

## 2023-01-02 DIAGNOSIS — E282 Polycystic ovarian syndrome: Secondary | ICD-10-CM | POA: Diagnosis not present

## 2023-01-02 DIAGNOSIS — N978 Female infertility of other origin: Secondary | ICD-10-CM | POA: Diagnosis not present

## 2023-01-04 DIAGNOSIS — E559 Vitamin D deficiency, unspecified: Secondary | ICD-10-CM | POA: Diagnosis not present

## 2023-01-04 DIAGNOSIS — E119 Type 2 diabetes mellitus without complications: Secondary | ICD-10-CM | POA: Diagnosis not present

## 2023-01-04 DIAGNOSIS — R7989 Other specified abnormal findings of blood chemistry: Secondary | ICD-10-CM | POA: Diagnosis not present

## 2023-01-04 DIAGNOSIS — N978 Female infertility of other origin: Secondary | ICD-10-CM | POA: Diagnosis not present

## 2023-01-04 DIAGNOSIS — Z3181 Encounter for male factor infertility in female patient: Secondary | ICD-10-CM | POA: Diagnosis not present

## 2023-01-29 DIAGNOSIS — N978 Female infertility of other origin: Secondary | ICD-10-CM | POA: Diagnosis not present

## 2023-01-29 DIAGNOSIS — Z3141 Encounter for fertility testing: Secondary | ICD-10-CM | POA: Diagnosis not present

## 2023-01-31 DIAGNOSIS — M059 Rheumatoid arthritis with rheumatoid factor, unspecified: Secondary | ICD-10-CM | POA: Diagnosis not present

## 2023-01-31 DIAGNOSIS — N978 Female infertility of other origin: Secondary | ICD-10-CM | POA: Diagnosis not present

## 2023-02-01 DIAGNOSIS — M059 Rheumatoid arthritis with rheumatoid factor, unspecified: Secondary | ICD-10-CM | POA: Diagnosis not present

## 2023-02-01 DIAGNOSIS — Z3189 Encounter for other procreative management: Secondary | ICD-10-CM | POA: Diagnosis not present

## 2023-02-17 DIAGNOSIS — M069 Rheumatoid arthritis, unspecified: Secondary | ICD-10-CM | POA: Diagnosis not present

## 2023-02-17 DIAGNOSIS — J189 Pneumonia, unspecified organism: Secondary | ICD-10-CM | POA: Diagnosis not present

## 2023-02-17 DIAGNOSIS — N926 Irregular menstruation, unspecified: Secondary | ICD-10-CM | POA: Diagnosis not present

## 2023-02-17 DIAGNOSIS — R509 Fever, unspecified: Secondary | ICD-10-CM | POA: Diagnosis not present

## 2023-03-04 DIAGNOSIS — Z79899 Other long term (current) drug therapy: Secondary | ICD-10-CM | POA: Diagnosis not present

## 2023-03-04 DIAGNOSIS — Z8701 Personal history of pneumonia (recurrent): Secondary | ICD-10-CM | POA: Diagnosis not present

## 2023-03-06 DIAGNOSIS — M059 Rheumatoid arthritis with rheumatoid factor, unspecified: Secondary | ICD-10-CM | POA: Diagnosis not present

## 2023-04-09 DIAGNOSIS — Z124 Encounter for screening for malignant neoplasm of cervix: Secondary | ICD-10-CM | POA: Diagnosis not present

## 2023-04-09 DIAGNOSIS — Z01419 Encounter for gynecological examination (general) (routine) without abnormal findings: Secondary | ICD-10-CM | POA: Diagnosis not present

## 2023-04-09 DIAGNOSIS — N390 Urinary tract infection, site not specified: Secondary | ICD-10-CM | POA: Diagnosis not present

## 2023-04-09 DIAGNOSIS — Z6834 Body mass index (BMI) 34.0-34.9, adult: Secondary | ICD-10-CM | POA: Diagnosis not present

## 2023-04-19 DIAGNOSIS — M059 Rheumatoid arthritis with rheumatoid factor, unspecified: Secondary | ICD-10-CM | POA: Diagnosis not present

## 2023-04-19 DIAGNOSIS — M069 Rheumatoid arthritis, unspecified: Secondary | ICD-10-CM | POA: Diagnosis not present

## 2023-05-08 DIAGNOSIS — M059 Rheumatoid arthritis with rheumatoid factor, unspecified: Secondary | ICD-10-CM | POA: Diagnosis not present

## 2023-05-08 DIAGNOSIS — Z7960 Long term (current) use of unspecified immunomodulators and immunosuppressants: Secondary | ICD-10-CM | POA: Diagnosis not present

## 2023-05-08 DIAGNOSIS — Z791 Long term (current) use of non-steroidal anti-inflammatories (NSAID): Secondary | ICD-10-CM | POA: Diagnosis not present

## 2023-07-10 DIAGNOSIS — K219 Gastro-esophageal reflux disease without esophagitis: Secondary | ICD-10-CM | POA: Diagnosis not present

## 2023-07-10 DIAGNOSIS — Z791 Long term (current) use of non-steroidal anti-inflammatories (NSAID): Secondary | ICD-10-CM | POA: Diagnosis not present

## 2023-07-10 DIAGNOSIS — Z7960 Long term (current) use of unspecified immunomodulators and immunosuppressants: Secondary | ICD-10-CM | POA: Diagnosis not present

## 2023-07-10 DIAGNOSIS — M059 Rheumatoid arthritis with rheumatoid factor, unspecified: Secondary | ICD-10-CM | POA: Diagnosis not present

## 2023-07-11 ENCOUNTER — Other Ambulatory Visit (HOSPITAL_COMMUNITY): Payer: Self-pay

## 2023-07-11 ENCOUNTER — Ambulatory Visit (HOSPITAL_BASED_OUTPATIENT_CLINIC_OR_DEPARTMENT_OTHER): Payer: Self-pay | Admitting: Cardiovascular Disease

## 2023-07-11 ENCOUNTER — Other Ambulatory Visit (HOSPITAL_BASED_OUTPATIENT_CLINIC_OR_DEPARTMENT_OTHER)

## 2023-07-11 VITALS — BP 108/60 | HR 89 | Resp 16 | Ht 62.0 in | Wt 191.2 lb

## 2023-07-11 DIAGNOSIS — R002 Palpitations: Secondary | ICD-10-CM

## 2023-07-11 DIAGNOSIS — R079 Chest pain, unspecified: Secondary | ICD-10-CM

## 2023-07-11 DIAGNOSIS — R0602 Shortness of breath: Secondary | ICD-10-CM | POA: Diagnosis not present

## 2023-07-11 DIAGNOSIS — M05711 Rheumatoid arthritis with rheumatoid factor of right shoulder without organ or systems involvement: Secondary | ICD-10-CM | POA: Diagnosis not present

## 2023-07-11 DIAGNOSIS — D649 Anemia, unspecified: Secondary | ICD-10-CM | POA: Diagnosis not present

## 2023-07-11 DIAGNOSIS — R0789 Other chest pain: Secondary | ICD-10-CM

## 2023-07-11 DIAGNOSIS — Z720 Tobacco use: Secondary | ICD-10-CM

## 2023-07-11 MED ORDER — IVABRADINE HCL 5 MG PO TABS
15.0000 mg | ORAL_TABLET | Freq: Once | ORAL | 0 refills | Status: AC
  Filled 2023-07-11: qty 3, 1d supply, fill #0

## 2023-07-11 NOTE — Progress Notes (Signed)
 Cardiology Office Note:  .   Date:  07/16/2023  ID:  Sandra Sosa, DOB [birth year], MRN [id number] PCP: Pridgen, Taylar, NP  [facility] Providers Cardiologist:  None    History of Present Illness: .   Sandra Sosa is a 38 y.o. female with Rheumatoid Arthritis, PACs, PVCs hepatic steatosis, and atrial tachycardia who presents for follow up.  She was seen in clinic 11/25/15 due to palpitations.  She was referred for a 48 hour Holter that showed an average heart rate of 95 bpm and very rare PACs and PVCs.  It also showed a short run of atrial tachycardia. These were not associated with any symptoms at the time of the study. She also reported shortness of breath so she had an echo 12/12/15 that revealed LVEF 55-60% and no abnormalities.  At her visit 6 she continued to struggle with palpitations.  She had chest pain that was atypical.  However given her RA history she was referred for an ETT which revealed 1 mm ST depression.  She achieved 5.4 METS on a Bruce protocol.  She subsequently had a coronary CT-a which revealed known coronary disease and probable hepatic steatosis.  Discussed the use of AI scribe software for clinical note transcription with the patient, who gave verbal consent to proceed.  History of Present Illness Sandra Sosa experiences consistent palpitations and chest pressure, which have intensified since a medication change in October 2024. The chest pressure is described as a heaviness that is present most of the time. Her heart rate can increase significantly during physical activity, reaching up to 145-160 bpm. Palpitations occur almost daily and are sometimes accompanied by dizziness but not fainting. She also experiences episodes of waking up with a racing heart and feeling jittery, lasting about 30 seconds.  She has a history of rheumatoid arthritis and is currently on rituximab , having switched from her previous medication due to family planning considerations. She also takes  naproxen  daily as needed for pain management. Despite the medication change, her arthritis pain remains significant, particularly with weather changes, and she experiences swelling in her fingers and toes.  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 She aims to complete 6,000 to 8,000 steps daily but often feels exhausted by the end of the day. Her heart rate can increase significantly even with minimal exertion, such as sitting on the toilet.  Her past medical history includes a previous heart monitor study in 2017, which showed short runs of atrial tachycardia. She has not been monitoring her blood pressure at home. She also mentions a history of anemia and is considering testing for thalassemia due to chronic anemia concerns.  She consumes about 100 mg of caffeine daily through a protein shake and occasionally drinks tea or coffee. She has reduced her caffeine intake significantly from previous levels.   ROS:  As per HPI  Studies Reviewed: Aaron Aas   EKG Interpretation Date/Time:  Thursday July 11 2023 14:46:23 EDT Ventricular Rate:  77 PR Interval:  166 QRS Duration:  80 QT Interval:  388 QTC Calculation: 439 R Axis:   60  Text Interpretation: Normal sinus rhythm with sinus arrhythmia Normal ECG No previous ECGs available Confirmed by Maudine Sos ([id number]) on 07/11/2023 3:11:40 PM   Coronary CT-A 10/2017: IMPRESSION: 1. Coronary calcium score of 0. This was 0 percentile for age and sex matched control.   2. Normal coronary origin with right dominance.  3. No evidence of CAD.  Risk Assessment/Calculations:         Physical Exam:   VS:  BP 108/60 (BP Location: Left Arm, Patient Position: Sitting, Cuff Size: Normal)   Pulse 89   Resp 16   Ht 5\' 2"  (1.575 m)   Wt 191 lb 3.2 oz (86.7 kg)   SpO2 98%   BMI 34.97 kg/m  , BMI Body mass index is 34.97 kg/m. GENERAL:  Well  appearing HEENT: Pupils equal round and reactive, fundi not visualized, oral mucosa unremarkable NECK:  No jugular venous distention, waveform within normal limits, carotid upstroke brisk and symmetric, no bruits, no thyromegaly LUNGS:  Clear to auscultation bilaterally HEART:  RRR.  PMI not displaced or sustained,S1 and S2 within normal limits, no S3, no S4, no clicks, no rubs, no murmurs ABD:  Flat, positive bowel sounds normal in frequency in pitch, no bruits, no rebound, no guarding, no midline pulsatile mass, no hepatomegaly, no splenomegaly EXT:  2 plus pulses throughout, no edema, no cyanosis no clubbing.  UE contractures SKIN:  No rashes no nodules NEURO:  Cranial nerves II through XII grossly intact, motor grossly intact throughout PSYCH:  Cognitively intact, oriented to person place and time   ASSESSMENT AND PLAN: .    Assessment & Plan # Palpitations Intermittent palpitations with heart rates 145-160 bpm during activity, accompanied by chest pressure. Previous monitoring showed atrial tachycardia and PACs. Symptoms worsened since 2019. Differential includes thyroid  dysfunction and electrolyte imbalance. - Order heart monitor to assess current heart rhythm. - Check thyroid  function and electrolytes. - Consider low-dose medication to manage palpitations if indicated by monitor results.  # Chest pressure: - Given that RA increases risk for CAD, will repeat coronary CT-A.  If negative, encourage her to slowly work on increasing exercise.   # Chronic anemia Chronic anemia possibly related to rheumatoid arthritis and potential thalassemia, with symptoms of fatigue and palpitations. Previous abnormal thyroid  function may contribute. - Check thyroid  function. - Discuss thalassemia testing with primary care provider.  # Fatigue Persistent fatigue likely multifactorial, related to chronic anemia, rheumatoid arthritis, and medication side effects. - Encourage gradual increase in  physical activity, starting with 5 minutes on exercise bike. - Consider water-based exercises to reduce joint strain.  # Rheumatoid arthritis Rheumatoid arthritis with joint pain and swelling, managed with rituximab  and naproxen . - Continue current medication regimen. - Encourage low-impact exercises to improve joint function and reduce pain.  # PCOS PCOS contributing to fatigue and possibly affecting cardiovascular symptoms.   Dispo: f/u in 4 months  Signed, Maudine Sos, MD

## 2023-07-11 NOTE — Patient Instructions (Signed)
 Medication Instructions:  TAKE IVABRADINE  3 TABLETS 2 HOURS PRIOR TO CT   *If you need a refill on your cardiac medications before your next appointment, please call your pharmacy*  Lab Work: LP/CMET/THYROID  PANEL/HGB FRACTIONATION TODAY   If you have labs (blood work) drawn today and your tests are completely normal, you will receive your results only by: MyChart Message (if you have MyChart) OR A paper copy in the mail If you have any lab test that is abnormal or we need to change your treatment, we will call you to review the results.  Testing/Procedures: Your physician has requested that you have cardiac CT. Cardiac computed tomography (CT) is a painless test that uses an x-ray machine to take clear, detailed pictures of your heart. For further information please visit [email or web address] Please follow instruction sheet as given.  7 DAY ZIO   Follow-Up: At [facility], you and your health needs are our priority.  As part of our continuing mission to provide you with exceptional heart care, our providers are all part of one team.  This team includes your primary Cardiologist (physician) and Advanced Practice Providers or APPs (Physician Assistants and Nurse Practitioners) who all work together to provide you with the care you need, when you need it.  Your next appointment:   4 month(s)  Provider:   Slater Duncan, NP or Neomi Banks, NP    We recommend signing up for the patient portal called "MyChart".  Sign up information is provided on this After Visit Summary.  MyChart is used to connect with patients for Virtual Visits (Telemedicine).  Patients are able to view lab/test results, encounter notes, upcoming appointments, etc.  Non-urgent messages can be sent to your provider as well.   To learn more about what you can do with MyChart, go to [email or web address].   Other Instructions   Your cardiac CT will be scheduled at one of the below locations:   [facility] [address] [place], [place] [address] [phone]  OR  [facility] [address] [place], [place] [address] [phone]  OR   [facility] [address] [place], [place] [address] [phone]  OR   [facility] [address] [place], [place] [address] [phone]  OR   [facility] [address]  [place], [place] [address] Opening July 15, 2023  If scheduled at [facility], please arrive at the [facility] and Children's Entrance (Entrance C2) of [facility] 30 minutes prior to test start time. You can use the FREE valet parking offered at entrance C (encouraged to control the heart rate for the test)  Proceed to the [facility] Radiology Department (first floor) to check-in and test prep.   All radiology patients and guests should use entrance C2 at [facility], accessed from [facility], even though the hospital's physical address listed is [address]    If scheduled at the Heart and Vascular Tower at [address], please enter the parking lot using the [address] entrance and use the FREE valet service at the patient drop-off area. Enter the buidling and check-in with registration on the main floor.  If scheduled at [facility] or [facility], please arrive 15 mins early for check-in and test prep.  There is spacious parking and easy access to the radiology department from the [facility] Heart and  Vascular entrance. Please enter here and check-in with the desk attendant.   If scheduled at [facility], please arrive 30 minutes early for check-in and test prep.  Please follow these instructions carefully (unless otherwise directed):  An IV will be required for this test and Nitroglycerin  will be given.  Hold all erectile  dysfunction medications at least 3 days (72 hrs) prior to test. (Ie viagra, cialis, sildenafil, tadalafil, etc)   On the Night Before the Test: Be sure to Drink plenty of water. Do not consume any caffeinated/decaffeinated beverages or chocolate 12 hours prior to your test. Do not take any antihistamines 12 hours prior to your test.  On the Day of the Test: Drink plenty of water until 1 hour prior to the test. Do not eat any food 1 hour prior to test. You may take your regular medications prior to the test.  Take metoprolol  (Lopressor ) two hours prior to test. If you take Furosemide/Hydrochlorothiazide/Spironolactone/Chlorthalidone, please HOLD on the morning of the test. Patients who wear a continuous glucose monitor MUST remove the device prior to scanning. FEMALES- please wear underwire-free bra if available, avoid dresses & tight clothing      After the Test: Drink plenty of water. After receiving IV contrast, you may experience a mild flushed feeling. This is normal. On occasion, you may experience a mild rash up to 24 hours after the test. This is not dangerous. If this occurs, you can take Benadryl  25 mg, Zyrtec, Claritin, or Allegra and increase your fluid intake. (Patients taking Tikosyn should avoid Benadryl , and may take Zyrtec, Claritin, or Allegra) If you experience trouble breathing, this can be serious. If it is severe call 911 IMMEDIATELY. If it is mild, please call our office.  We will call to schedule your test 2-4 weeks out understanding that some insurance companies will need an authorization prior to the service being performed.   For more information and frequently asked questions, please visit our website : [email or web address]  For non-scheduling related questions, please contact the cardiac imaging nurse navigator should you have any questions/concerns: Cardiac Imaging Nurse Navigators Direct Office Dial: [phone]   For scheduling needs,  including cancellations and rescheduling, please call [place], [phone].  Cardiac CT Angiogram A cardiac CT angiogram is a procedure to look at the heart and the area around the heart. It may be done to help find the cause of chest pains or other symptoms of heart disease. During this procedure, a substance called contrast dye is injected into a vein in the arm. The contrast highlights the blood vessels in the area to be checked. A large X-ray machine (CT scanner), then takes detailed pictures of the heart and the surrounding area. The procedure is also sometimes called a coronary CT angiogram, coronary artery scanning, or CTA. A cardiac CT angiogram allows the health care provider to see how well blood is flowing to and from the heart. The provider will be able to see if there are any problems, such as: Blockage or narrowing of the arteries in the heart. Fluid around the heart. Signs of weakness or disease in the muscles, valves, and tissues of the heart. Tell a health care provider about: Any allergies you have. This is especially important if you have had a previous allergic reaction to medicines, contrast dye, or iodine. All medicines you are taking, including vitamins, herbs, eye drops, creams, and over-the-counter medicines. Any bleeding problems you have. Any surgeries you have had. Any medical conditions you have, including kidney  problems or kidney failure. Whether you are pregnant or may be pregnant. Any anxiety disorders, chronic pain, or other conditions you have. These may increase your stress or prevent you from lying still. Any history of abnormal heart rhythms or heart procedures. What are the risks? Your provider will talk with you about risks. These may include: Bleeding. Infection. Allergic reactions to medicines or dyes. Damage to other structures or organs. Kidney damage from the contrast dye. Increased risk of cancer from radiation exposure. This risk is low. Talk  with your provider about: The risks and benefits of testing. How you can receive the lowest dose of radiation. What happens before the procedure? Wear comfortable clothing and remove any jewelry, glasses, dentures, and hearing aids. Follow instructions from your provider about eating and drinking. These may include: 12 hours before the procedure Avoid caffeine. This includes tea, coffee, soda, energy drinks, and diet pills. Drink plenty of water or other fluids that do not have caffeine in them. Being well hydrated can prevent complications. 4-6 hours before the procedure Stop eating and drinking. This will reduce the risk of nausea from the contrast dye. Ask your provider about changing or stopping your regular medicines. These include: Diabetes medicines. Medicines to treat problems with erections (erectile dysfunction). If you have kidney problems, you may need to receive IV hydration before and after the test. What happens during the procedure?  Hair on your chest may need to be removed so that small sticky patches called electrodes can be placed on your chest. These will transmit information that helps to monitor your heart during the procedure. An IV will be inserted into one of your veins. You might be given a medicine to control your heart rate during the procedure. This will help to ensure that good images are obtained. You will be asked to lie on an exam table. This table will slide in and out of the CT machine during the procedure. Contrast dye will be injected into the IV. You might feel warm, or you may get a metallic taste in your mouth. You may be given medicines to relax or dilate the arteries in your heart. If you are allergic to contrast dyes or iodine you may be given medicine before the test to reduce the risk of an allergic reaction. The table that you are lying on will move into the CT machine tunnel for the scan. The person running the machine will give you  instructions while the scans are being done. You may be asked to: Keep your arms above your head. Hold your breath for short periods. Stay very still, even if the table is moving. The procedure may vary among providers and hospitals. What can I expect after the procedure? After your procedure, it is common to have: A metallic taste in your mouth from the contrast dye. A feeling of warmth. A headache from the heart medicine. Follow these instructions at home: Take over-the-counter and prescription medicines only as told by your provider. If you are told, drink enough fluid to keep your pee pale yellow. This will help to flush the contrast dye out of your body. Most people can return to their normal activities right after the procedure. Ask your provider what activities are safe for you. It is up to you to get the results of your procedure. Ask your provider, or the department that is doing the procedure, when your results will be ready. Contact a health care provider if: You have any symptoms of allergy to  the contrast dye. These include: Shortness of breath. Rash or hives. A racing heartbeat. You notice a change in your peeing (urination). This information is not intended to replace advice given to you by your health care provider. Make sure you discuss any questions you have with your health care provider. Document Revised: 10/06/2021 Document Reviewed: 10/06/2021 Elsevier Patient Education  2024 Elsevier Inc.  Delane Fear- Long Term Monitor Instructions  Your physician has requested you wear a ZIO patch monitor for 7 days.  This is a single patch monitor. [employer] supplies one patch monitor per enrollment. Additional stickers are not available. Please do not apply patch if you will be having a Nuclear Stress Test,  Echocardiogram, Cardiac CT, MRI, or Chest Xray during the period you would be wearing the  monitor. The patch cannot be worn during these tests. You cannot remove and re-apply  the  ZIO XT patch monitor.  Your ZIO patch monitor will be mailed 3 day USPS to your address on file. It may take 3-5 days  to receive your monitor after you have been enrolled.  Once you have received your monitor, please review the enclosed instructions. Your monitor  has already been registered assigning a specific monitor serial # to you.  Billing and Patient Assistance Program Information  We have supplied [employer] with any of your insurance information on file for billing purposes. [employer] offers a sliding scale Patient Assistance Program for patients that do not have  insurance, or whose insurance does not completely cover the cost of the ZIO monitor.  You must apply for the Patient Assistance Program to qualify for this discounted rate.  To apply, please call [employer] at [phone], select option 4, select option 2, ask to apply for  Patient Assistance Program. Sanna Crystal will ask your household income, and how many people  are in your household. They will quote your out-of-pocket cost based on that information.  [employer] will also be able to set up a 38-month, interest-free payment plan if needed.  Applying the monitor   Shave hair from upper left chest.  Hold abrader disc by orange tab. Rub abrader in 40 strokes over the upper left chest as  indicated in your monitor instructions.  Clean area with 4 enclosed alcohol pads. Let dry.  Apply patch as indicated in monitor instructions. Patch will be placed under collarbone on left  side of chest with arrow pointing upward.  Rub patch adhesive wings for 2 minutes. Remove white label marked "1". Remove the white  label marked "2". Rub patch adhesive wings for 2 additional minutes.  While looking in a mirror, press and release button in center of patch. A small green light will  flash 3-4 times. This will be your only indicator that the monitor has been turned on.  Do not shower for the first 24 hours. You may shower after the first 24  hours.  Press the button if you feel a symptom. You will hear a small click. Record Date, Time and  Symptom in the Patient Logbook.  When you are ready to remove the patch, follow instructions on the last 2 pages of Patient  Logbook. Stick patch monitor onto the last page of Patient Logbook.  Place Patient Logbook in the blue and white box. Use locking tab on box and tape box closed  securely. The blue and white box has prepaid postage on it. Please place it in the mailbox as  soon as possible. Your physician should have your test results  approximately 7 days after the  monitor has been mailed back to [place].  Call [facility] Customer Care at [phone] if you have questions regarding  your ZIO XT patch monitor. Call them immediately if you see an orange light blinking on your  monitor.  If your monitor falls off in less than 4 days, contact our Monitor department at [phone].  If your monitor becomes loose or falls off after 4 days call [employer] at [phone] for  suggestions on securing your monitor

## 2023-07-16 ENCOUNTER — Encounter (HOSPITAL_BASED_OUTPATIENT_CLINIC_OR_DEPARTMENT_OTHER): Payer: Self-pay | Admitting: Cardiovascular Disease

## 2023-07-16 LAB — COMPREHENSIVE METABOLIC PANEL WITH GFR
ALT: 41 IU/L — ABNORMAL HIGH (ref 0–32)
AST: 35 IU/L (ref 0–40)
Albumin: 4.5 g/dL (ref 3.9–4.9)
Alkaline Phosphatase: 59 IU/L (ref 44–121)
BUN/Creatinine Ratio: 28 — ABNORMAL HIGH (ref 9–23)
BUN: 15 mg/dL (ref 6–20)
Bilirubin Total: 0.3 mg/dL (ref 0.0–1.2)
CO2: 21 mmol/L (ref 20–29)
Calcium: 9.2 mg/dL (ref 8.7–10.2)
Chloride: 103 mmol/L (ref 96–106)
Creatinine, Ser: 0.54 mg/dL — ABNORMAL LOW (ref 0.57–1.00)
Globulin, Total: 2.9 g/dL (ref 1.5–4.5)
Glucose: 84 mg/dL (ref 70–99)
Potassium: 3.9 mmol/L (ref 3.5–5.2)
Sodium: 139 mmol/L (ref 134–144)
Total Protein: 7.4 g/dL (ref 6.0–8.5)
eGFR: 122 mL/min/{1.73_m2} (ref >59)

## 2023-07-16 LAB — HGB FRACTIONATION CASCADE
Hgb A2: 1.9 % (ref 1.8–3.2)
Hgb A: 98.1 % (ref 96.4–98.8)
Hgb F: 0 % (ref 0.0–2.0)
Hgb S: 0 %

## 2023-07-16 LAB — LIPID PANEL
Chol/HDL Ratio: 2.6 ratio (ref 0.0–4.4)
Cholesterol, Total: 156 mg/dL (ref 100–199)
HDL: 60 mg/dL (ref >39)
LDL Chol Calc (NIH): 75 mg/dL (ref 0–99)
Triglycerides: 118 mg/dL (ref 0–149)
VLDL Cholesterol Cal: 21 mg/dL (ref 5–40)

## 2023-07-16 LAB — THYROID PANEL WITH TSH
Free Thyroxine Index: 2.3 (ref 1.2–4.9)
T3 Uptake Ratio: 31 % (ref 24–39)
T4, Total: 7.5 ug/dL (ref 4.5–12.0)
TSH: 2.57 u[IU]/mL (ref 0.450–4.500)

## 2023-07-22 ENCOUNTER — Other Ambulatory Visit (HOSPITAL_COMMUNITY): Payer: Self-pay

## 2023-08-05 ENCOUNTER — Ambulatory Visit (HOSPITAL_BASED_OUTPATIENT_CLINIC_OR_DEPARTMENT_OTHER): Payer: Self-pay | Admitting: Cardiovascular Disease

## 2023-08-09 ENCOUNTER — Telehealth (HOSPITAL_COMMUNITY): Payer: Self-pay | Admitting: Emergency Medicine

## 2023-08-09 NOTE — Telephone Encounter (Signed)
 Attempted to call patient regarding upcoming cardiac CT appointment. Left message on voicemail with name and callback number Rockwell Alexandria RN Navigator Cardiac Imaging [facility] V[facility] Services [phone] Office [phone] Cell

## 2023-08-13 ENCOUNTER — Ambulatory Visit (HOSPITAL_COMMUNITY)
Admission: RE | Admit: 2023-08-13 | Source: Ambulatory Visit | Attending: Cardiovascular Disease | Admitting: Cardiovascular Disease

## 2023-08-20 DIAGNOSIS — R0602 Shortness of breath: Secondary | ICD-10-CM | POA: Diagnosis not present

## 2023-08-20 DIAGNOSIS — R079 Chest pain, unspecified: Secondary | ICD-10-CM | POA: Diagnosis not present

## 2023-08-20 DIAGNOSIS — R002 Palpitations: Secondary | ICD-10-CM

## 2023-09-03 ENCOUNTER — Encounter (HOSPITAL_BASED_OUTPATIENT_CLINIC_OR_DEPARTMENT_OTHER): Payer: Self-pay | Admitting: Cardiovascular Disease

## 2023-09-03 MED ORDER — IVABRADINE HCL 5 MG PO TABS: 15.0000 mg | ORAL_TABLET | Freq: Once | ORAL | 0 refills | Status: AC

## 2023-09-04 ENCOUNTER — Encounter (HOSPITAL_COMMUNITY): Payer: Self-pay

## 2023-09-06 ENCOUNTER — Ambulatory Visit (HOSPITAL_COMMUNITY)

## 2023-10-31 ENCOUNTER — Encounter (HOSPITAL_COMMUNITY): Payer: Self-pay

## 2023-11-15 ENCOUNTER — Ambulatory Visit (HOSPITAL_BASED_OUTPATIENT_CLINIC_OR_DEPARTMENT_OTHER): Admitting: Family
